# Patient Record
Sex: Male | Born: 1985 | Race: White | Hispanic: Yes | Marital: Married | Smoking: Never smoker
Health system: Southern US, Community
[De-identification: ages and names within clinical notes are randomized; demographics above are authoritative.]

## PROBLEM LIST (undated history)

## (undated) DIAGNOSIS — Z789 Other specified health status: Secondary | ICD-10-CM

## (undated) HISTORY — PX: NO PAST SURGERIES: SHX2092

## (undated) HISTORY — PX: APPENDECTOMY: SHX54

---

## 2008-04-13 ENCOUNTER — Emergency Department (HOSPITAL_COMMUNITY): Admission: EM | Admit: 2008-04-13 | Discharge: 2008-04-13 | Payer: Self-pay | Admitting: Emergency Medicine

## 2011-07-25 ENCOUNTER — Encounter (HOSPITAL_COMMUNITY): Payer: Self-pay | Admitting: *Deleted

## 2011-07-25 DIAGNOSIS — L03019 Cellulitis of unspecified finger: Secondary | ICD-10-CM | POA: Insufficient documentation

## 2011-07-25 DIAGNOSIS — L02519 Cutaneous abscess of unspecified hand: Secondary | ICD-10-CM | POA: Insufficient documentation

## 2011-07-25 NOTE — ED Notes (Signed)
Pt reports he woke up today with a ?bite mark on rt thumb, redness noted

## 2011-07-26 ENCOUNTER — Emergency Department (HOSPITAL_COMMUNITY)
Admission: EM | Admit: 2011-07-26 | Discharge: 2011-07-26 | Disposition: A | Discharge status: Home / Self Care | Payer: Self-pay | Attending: Emergency Medicine | Admitting: Emergency Medicine

## 2011-07-26 DIAGNOSIS — L0390 Cellulitis, unspecified: Secondary | ICD-10-CM

## 2011-07-26 MED ORDER — CLINDAMYCIN HCL 150 MG PO CAPS: 150.0000 mg | ORAL_CAPSULE | Freq: Four times a day (QID) | ORAL | Status: AC

## 2011-07-26 MED ORDER — LIDOCAINE-EPINEPHRINE (PF) 1 %-1:200000 IJ SOLN
INTRAMUSCULAR | Status: AC
  Filled 2011-07-26: qty 10

## 2011-07-26 MED ORDER — IBUPROFEN 800 MG PO TABS: 800.0000 mg | ORAL_TABLET | Freq: Three times a day (TID) | ORAL | Status: AC

## 2011-07-26 MED ORDER — VANCOMYCIN HCL IN DEXTROSE 1-5 GM/200ML-% IV SOLN
1000.0000 mg | Freq: Once | INTRAVENOUS | Status: AC
  Administered 2011-07-26: 1000 mg via INTRAVENOUS
  Filled 2011-07-26: qty 200

## 2011-07-26 MED ORDER — LIDOCAINE HCL (PF) 1 % IJ SOLN
5.0000 mL | Freq: Once | INTRAMUSCULAR | Status: AC
  Administered 2011-07-26: 5 mL

## 2011-07-26 NOTE — ED Notes (Signed)
Per family.  Pt was bitten on Tuesday morning by unknown insect, and area has increased in redness and swelling since that time.  Presently right thumb and hand is swollen and red.  Mild redness into wrist area. No streaking noted.

## 2011-07-26 NOTE — ED Provider Notes (Signed)
History     CSN: [id number]  Arrival date & time 07/25/11  2242   First MD Initiated Contact with Patient 07/26/11 0142      Chief Complaint  Patient presents with  . Insect Bite    (Consider location/radiation/quality/duration/timing/severity/associated sxs/prior treatment) The history is provided by the patient.  Patient woke up today with pain and redness over his right thumb. There is a central area where he believes he may have been bit by something, but he did not visualize any insect or spider. No known trauma otherwise. He has progressive redness and swelling tonight. He denies any weakness or numbness. No history of abscess or similar symptoms in the past. No fevers or chills. No nausea or vomiting. No difficulty using his hand. Pain is sharp in quality and not radiating. Moderate in severity  History reviewed. No pertinent past medical history.  Past Surgical History  Procedure Date  . Appendectomy     No family history on file.  History  Substance Use Topics  . Smoking status: Never Smoker   . Smokeless tobacco: Not on file  . Alcohol Use: Yes      Review of Systems  Constitutional: Negative for fever and chills.  HENT: Negative for neck pain and neck stiffness.   Eyes: Negative for pain.  Respiratory: Negative for shortness of breath.   Cardiovascular: Negative for chest pain.  Gastrointestinal: Negative for abdominal pain.  Genitourinary: Negative for dysuria.  Musculoskeletal: Negative for myalgias.  Skin: Positive for wound.  Neurological: Negative for headaches.  All other systems reviewed and are negative.    Allergies  Review of patient's allergies indicates no known allergies.  Home Medications   Current Outpatient Rx  Name Route Sig Dispense Refill  . ZYRTEC ALLERGY PO Oral Take by mouth.      BP 117/76  Pulse 75  Temp(Src) 98.6 F (37 C) (Oral)  Resp 20  Ht 5\' 9"  (1.753 m)  Wt 166 lb (75.297 kg)  BMI 24.51 kg/m2  SpO2  98%  Physical Exam  Constitutional: He is oriented to person, place, and time. He appears well-developed and well-nourished.  HENT:  Head: Normocephalic and atraumatic.  Eyes: Conjunctivae and EOM are normal. Pupils are equal, round, and reactive to light.  Neck: Trachea normal. Neck supple. No thyromegaly present.  Cardiovascular: Normal rate, regular rhythm, S1 normal, S2 normal and normal pulses.     No systolic murmur is present   No diastolic murmur is present  Pulses:      Radial pulses are 2+ on the right side, and 2+ on the left side.  Pulmonary/Chest: Effort normal and breath sounds normal. He has no wheezes. He has no rhonchi. He has no rales. He exhibits no tenderness.  Abdominal: Soft. Normal appearance and bowel sounds are normal. There is no tenderness. There is no CVA tenderness and negative Murphy's sign.  Musculoskeletal:       Right upper extremity: Area of tenderness and pointing over the PIP joint of right thumb. No active drainage. There is surrounding erythema that extends just to the wrist. No associated lymphadenopathy. Full range of motion throughout with distal neurovascular intact.  Neurological: He is alert and oriented to person, place, and time. He has normal strength. No cranial nerve deficit or sensory deficit. GCS eye subscore is 4. GCS verbal subscore is 5. GCS motor subscore is 6.  Skin: Skin is warm and dry. No rash noted. He is not diaphoretic.  Psychiatric: His speech is normal.  Cooperative and appropriate    ED Course  INCISION AND DRAINAGE Date/Time: 07/26/2011 3:44 AM Performed by: Sunnie Nielsen Authorized by: Sunnie Nielsen Consent: Verbal consent obtained. Risks and benefits: risks, benefits and alternatives were discussed Consent given by: patient Patient understanding: patient states understanding of the procedure being performed Patient consent: the patient's understanding of the procedure matches consent given Procedure consent:  procedure consent matches procedure scheduled Patient identity confirmed: verbally with patient Time out: Immediately prior to procedure a "time out" was called to verify the correct patient, procedure, equipment, support staff and site/side marked as required. Type: abscess Location:  right thumb. Anesthesia: local infiltration Local anesthetic: lidocaine 1% without epinephrine Anesthetic total: 1 ml Risk factor: underlying major vessel, underlying major nerve and coagulopathy Needle gauge: 22 Complexity: simple Drainage: serosanguinous Packing material: Vaseline gauze Patient tolerance: Patient tolerated the procedure well with no immediate complications.   Ink outline of wound performed  IV vancomycin  MDM  Clinical abscess with surrounding cellulitis right thumb.  IV antibiotics. Wound unroofed as above without any significant purulent drainage.  Plan discharge home and return in 48 hours for wound check. Prescription for antibiotics provided. Reliable historian and agrees to return sooner for any worsening condition.          Sunnie Nielsen, MD 07/26/11 [phone]

## 2012-05-26 ENCOUNTER — Emergency Department (HOSPITAL_COMMUNITY)
Admission: EM | Admit: 2012-05-26 | Discharge: 2012-05-26 | Disposition: A | Discharge status: Home / Self Care | Payer: Self-pay | Attending: Emergency Medicine | Admitting: Emergency Medicine

## 2012-05-26 ENCOUNTER — Emergency Department (HOSPITAL_COMMUNITY): Payer: Self-pay

## 2012-05-26 ENCOUNTER — Other Ambulatory Visit: Payer: Self-pay

## 2012-05-26 ENCOUNTER — Encounter (HOSPITAL_COMMUNITY): Payer: Self-pay | Admitting: Emergency Medicine

## 2012-05-26 DIAGNOSIS — R071 Chest pain on breathing: Secondary | ICD-10-CM | POA: Insufficient documentation

## 2012-05-26 DIAGNOSIS — R0789 Other chest pain: Secondary | ICD-10-CM

## 2012-05-26 DIAGNOSIS — R209 Unspecified disturbances of skin sensation: Secondary | ICD-10-CM | POA: Insufficient documentation

## 2012-05-26 MED ORDER — HYDROCODONE-ACETAMINOPHEN 5-325 MG PO TABS: 1.0000 | ORAL_TABLET | Freq: Four times a day (QID) | ORAL | Status: DC | PRN

## 2012-05-26 MED ORDER — NAPROXEN SODIUM 220 MG PO TABS
ORAL_TABLET | ORAL | Status: DC
Start: 2012-05-26 — End: 2013-10-15

## 2012-05-26 MED ORDER — IBUPROFEN 800 MG PO TABS
800.0000 mg | ORAL_TABLET | Freq: Once | ORAL | Status: AC
  Administered 2012-05-26: 800 mg via ORAL
  Filled 2012-05-26: qty 1

## 2012-05-26 MED ORDER — HYDROCODONE-ACETAMINOPHEN 5-325 MG PO TABS
1.0000 | ORAL_TABLET | Freq: Once | ORAL | Status: AC
  Administered 2012-05-26: 1 via ORAL
  Filled 2012-05-26: qty 1

## 2012-05-26 NOTE — ED Provider Notes (Signed)
History     CSN: [id number]  Arrival date & time 05/26/12  0202   First MD Initiated Contact with Patient 05/26/12 0327      Chief Complaint  Patient presents with  . Chest Pain    (Consider location/radiation/quality/duration/timing/severity/associated sxs/prior treatment) HPI This is a 26 year old Hispanic male; his friend serves as Equities trader. He is here with left-sided chest pain that began about 1:30 this morning. He describes it as throbbing and is associated with some paresthesias of the left arm. It has been waxing and waning. It is present on the left side of his chest as well as his left scapula. The left chest pain extends down to his upper abdomen. The pain is worse with movement of the neck, of the left shoulder, of the chest or with deep breathing. It is also worse with palpation. The pain is moderate to severe. The pain makes it difficult to take a deep breath. He is not short of breath. He is not nauseated. He denies chest injury but does work with [employer] for living.  History reviewed. No pertinent past medical history.  Past Surgical History  Procedure Date  . Appendectomy     Family History  Problem Relation Age of Onset  . CAD Father   . Hypertension Other     History  Substance Use Topics  . Smoking status: Never Smoker   . Smokeless tobacco: Not on file  . Alcohol Use: Yes     Comment: occ      Review of Systems  All other systems reviewed and are negative.    Allergies  Review of patient's allergies indicates no known allergies.  Home Medications   Current Outpatient Rx  Name  Route  Sig  Dispense  Refill  . PSEUDOEPHEDRINE HCL 60 MG PO TABS   Oral   Take 60 mg by mouth every 4 (four) hours as needed. For cold symptoms           BP 121/83  Pulse 88  Temp 97.9 F (36.6 C) (Oral)  Resp 12  SpO2 100%  Physical Exam General: Well-developed, well-nourished male in no acute distress; appearance consistent with age of record;  appears uncomfortable HENT: normocephalic, atraumatic Eyes: pupils equal round and reactive to light; extraocular muscles intact Neck: supple; pain is somewhat exacerbated with movement of neck Heart: regular rate and rhythm; no murmurs, rubs or gallops Lungs: clear to auscultation bilaterally Chest: Tender to palpation left side of chest without deformity or crepitus Abdomen: soft; nondistended; left upper quadrant tenderness; no masses or hepatosplenomegaly; bowel sounds present Extremities: No deformity; full range of motion but with pain on range of motion of left shoulder Neurologic: Awake, alert; motor function intact in all extremities and symmetric; no facial droop Skin: Warm and dry Psychiatric: Mildly anxious    ED Course  Procedures (including critical care time)     MDM  Nursing notes and vitals signs, including pulse oximetry, reviewed.  Summary of this visit's results, reviewed by myself:  Imaging Studies: Dg Chest 2 View  05/26/2012  *RADIOLOGY REPORT*  Clinical Data: Chest pain  CHEST - 2 VIEW  Comparison: None.  Findings: Shallow inspiration. The heart size and pulmonary vascularity are normal. The lungs appear clear and expanded without focal air space disease or consolidation. No blunting of the costophrenic angles.  No pneumothorax.  Mediastinal contours appear intact.  IMPRESSION: No evidence of active pulmonary disease.   Original Report Authenticated By: Burman Nieves, M.D.  Date: 05/26/2012 2:10 AM  Rate: 85  Rhythm: normal sinus rhythm  QRS Axis: normal  Intervals: normal  ST/T Wave abnormalities: normal  Conduction Disutrbances: none  Narrative Interpretation: unremarkable  Comparison with previous EKG: None available  3:40 AM Pain appears to be chest wall pain although cervical radiculopathy cannot be excluded especially in light of change with movement of the neck and paresthesias of the left arm. We will treat with analgesics. He was  advised that most such pain improves and several days. He was advised to minimize lifting with his left side.            Hanley Seamen, MD 05/26/12 [phone]

## 2012-05-26 NOTE — ED Notes (Signed)
Pt presents with c/o pain to his left side points from his lower abd all the way up his left side to his neck  Pt states pain started about 0130 and it comes and goes but when it comes it makes it difficult for him to breathe  Pt denies any other sxs  Pt states he was laying down when it started

## 2012-05-26 NOTE — ED Notes (Signed)
Pt admits to having 3 beers tonight

## 2013-09-07 IMAGING — CR DG CHEST 2V
2 series · 2 of 2 positions shown · non-contrast
Comparison: None.

CLINICAL DATA: Chest pain

CHEST - 2 VIEW

[w chest pa]
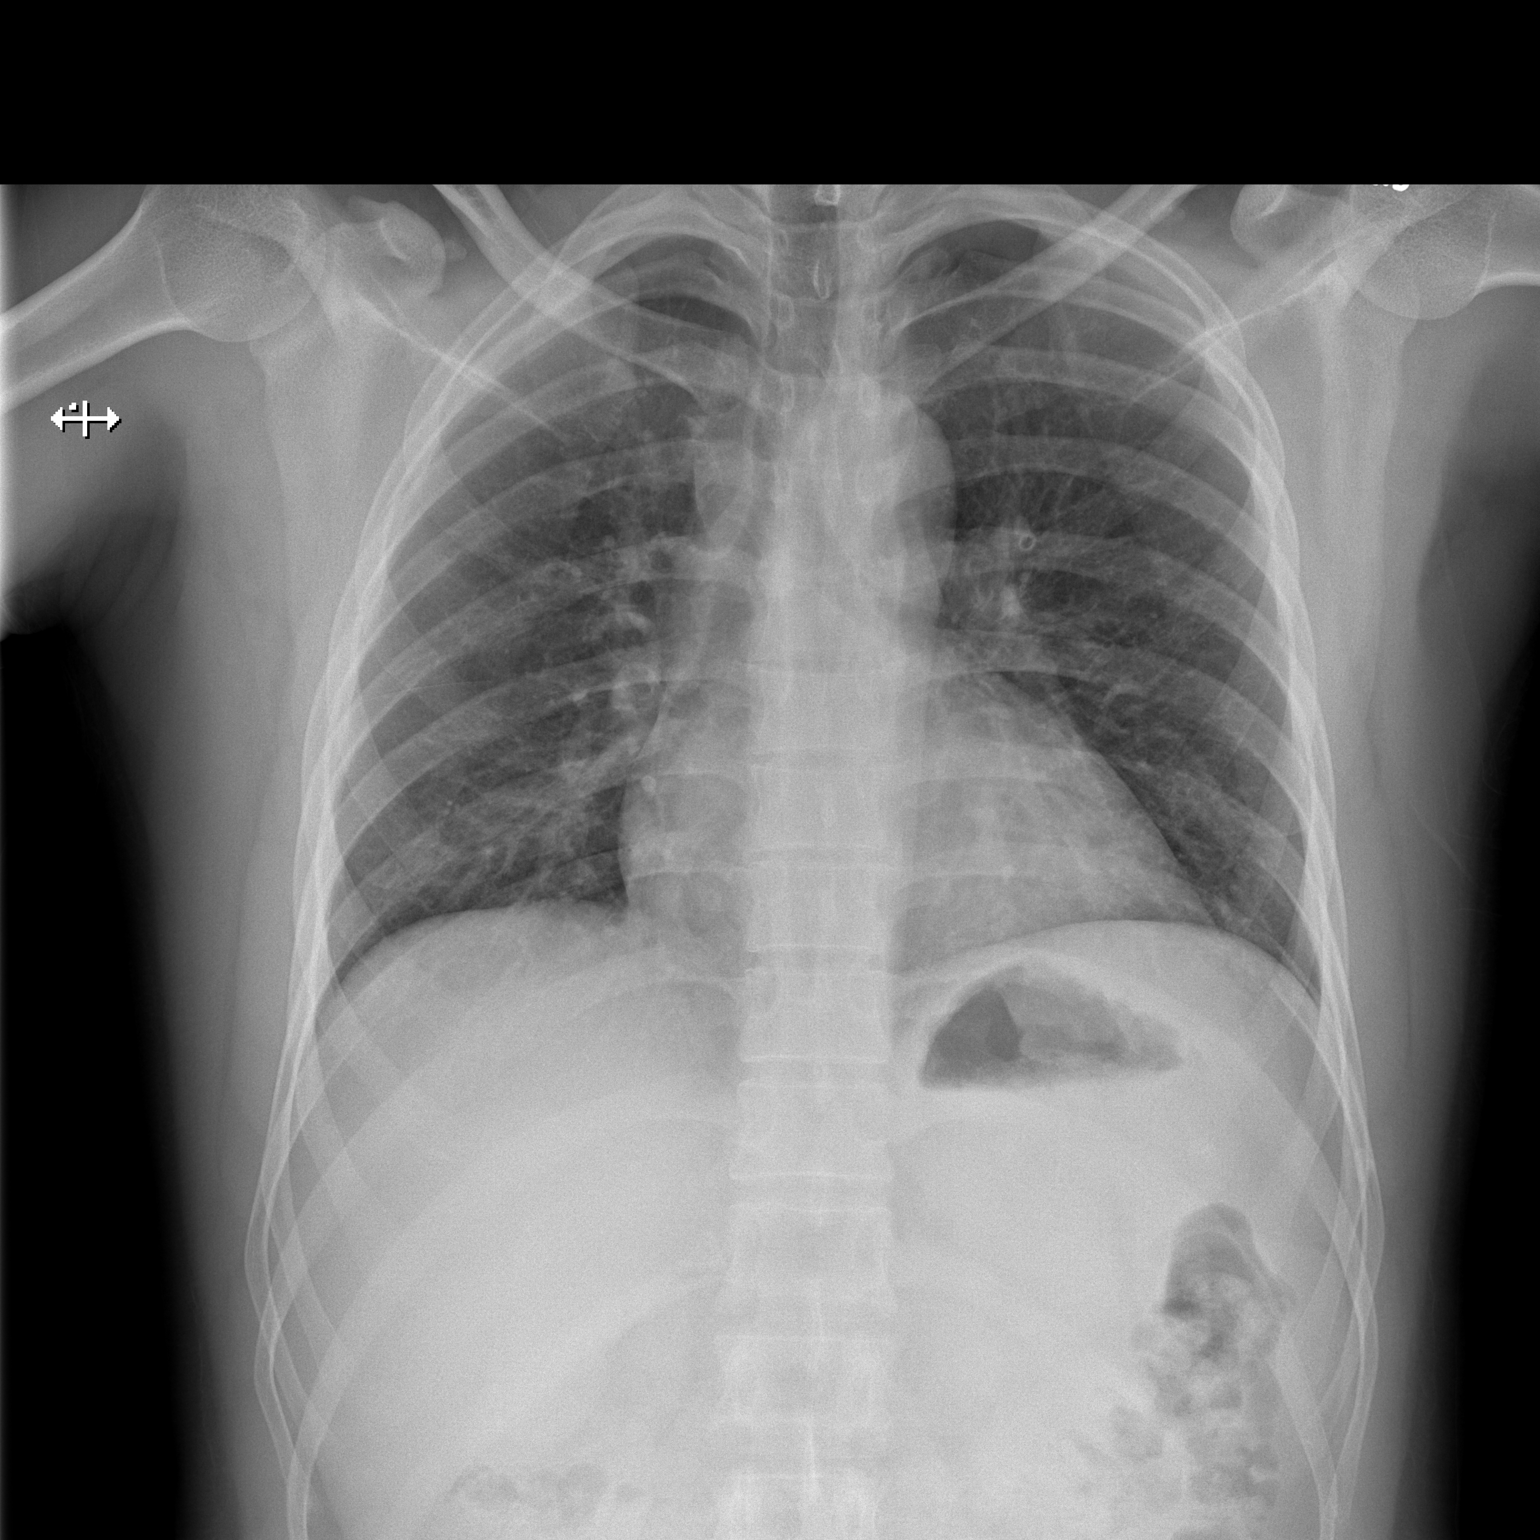

[w chest lat]
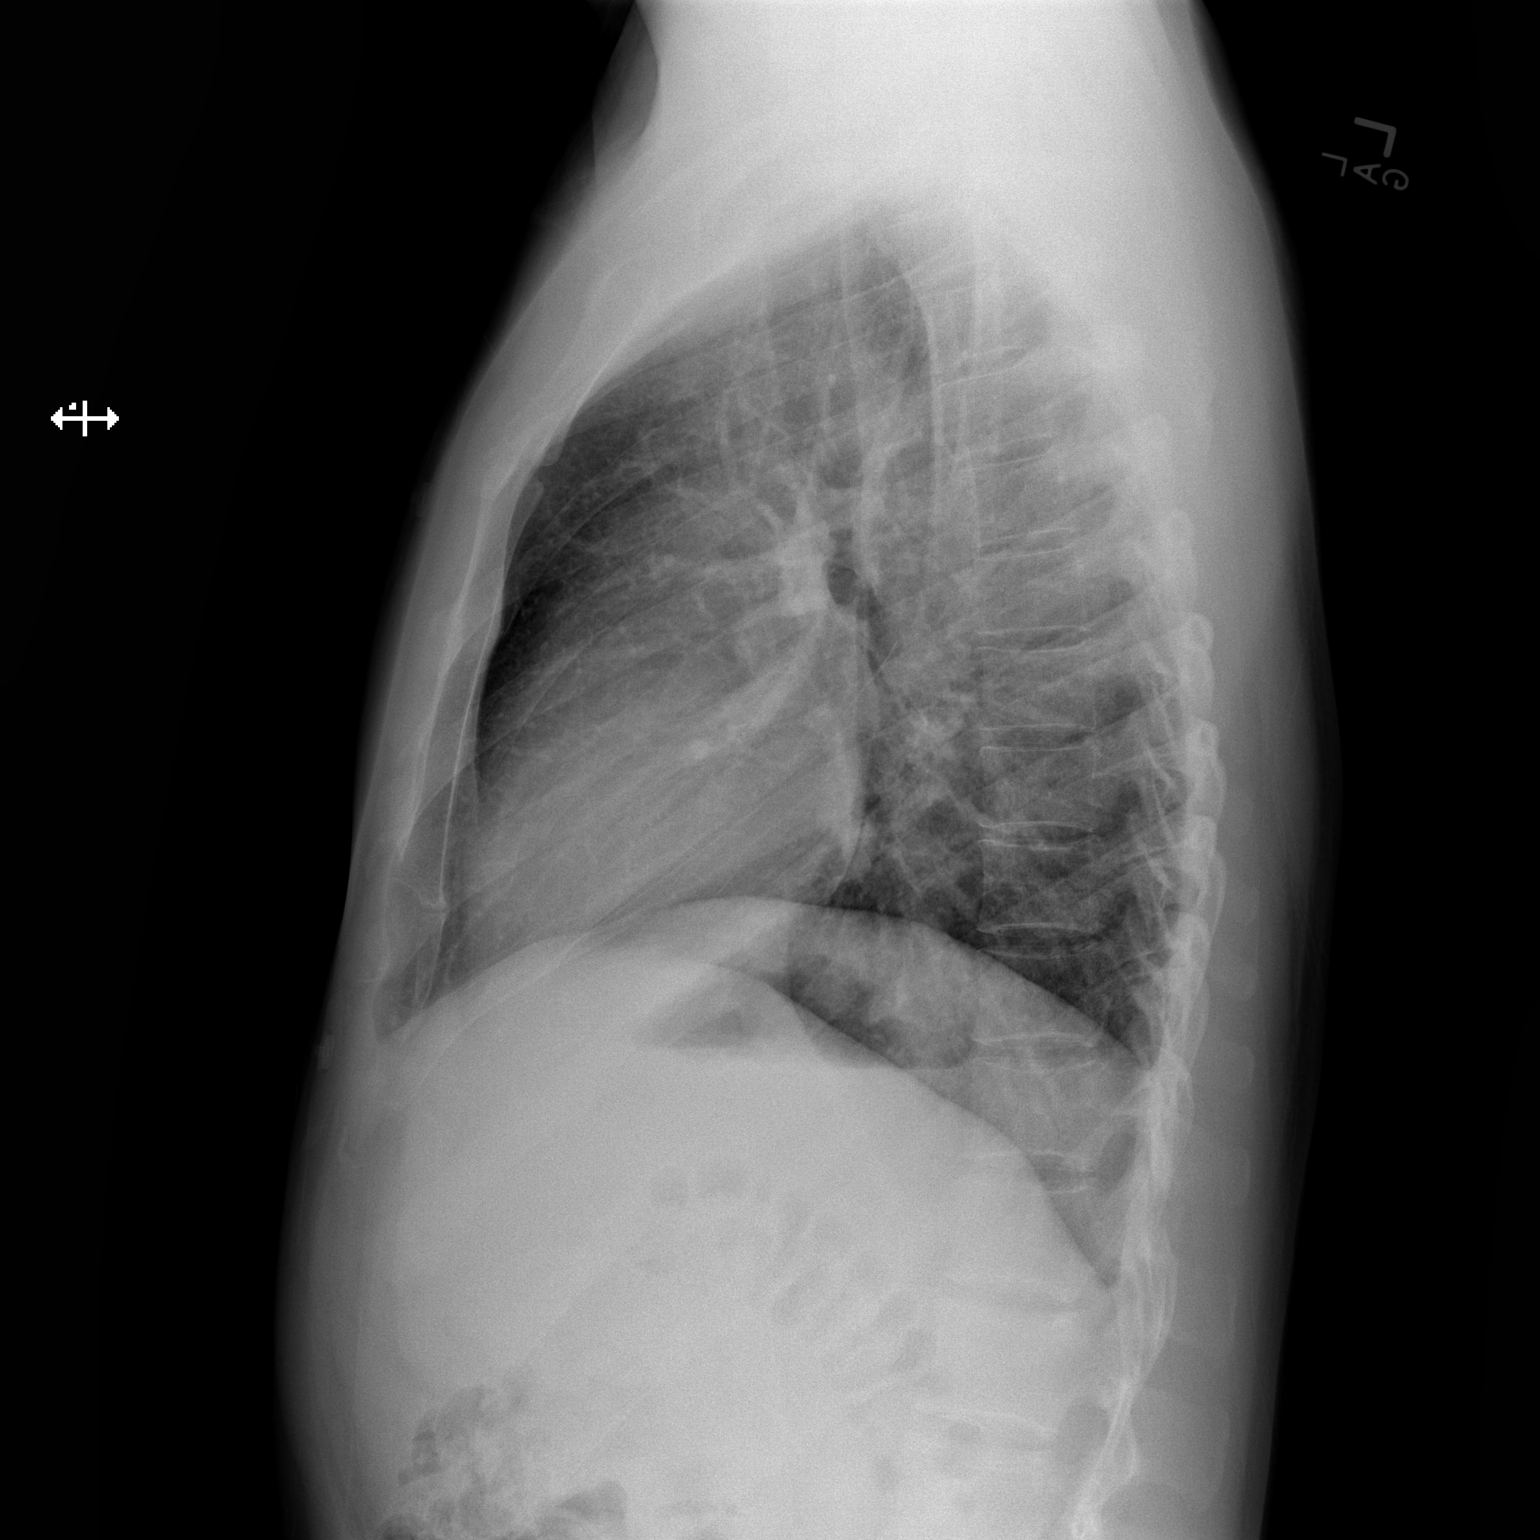

[2 of 2 positions shown; findings below may reference images not displayed]

FINDINGS: Shallow inspiration. The heart size and pulmonary
vascularity are normal. The lungs appear clear and expanded without
focal air space disease or consolidation. No blunting of the
costophrenic angles.  No pneumothorax.  Mediastinal contours appear
intact.
IMPRESSION: No evidence of active pulmonary disease.

## 2013-10-15 ENCOUNTER — Encounter (HOSPITAL_COMMUNITY): Payer: Self-pay | Admitting: Emergency Medicine

## 2013-10-15 ENCOUNTER — Emergency Department (HOSPITAL_COMMUNITY)
Admission: EM | Admit: 2013-10-15 | Discharge: 2013-10-16 | Disposition: A | Discharge status: Home / Self Care | Payer: Self-pay | Attending: Emergency Medicine | Admitting: Emergency Medicine

## 2013-10-15 ENCOUNTER — Emergency Department (HOSPITAL_COMMUNITY): Payer: Self-pay

## 2013-10-15 DIAGNOSIS — Z79899 Other long term (current) drug therapy: Secondary | ICD-10-CM | POA: Insufficient documentation

## 2013-10-15 DIAGNOSIS — Z792 Long term (current) use of antibiotics: Secondary | ICD-10-CM | POA: Insufficient documentation

## 2013-10-15 DIAGNOSIS — J40 Bronchitis, not specified as acute or chronic: Secondary | ICD-10-CM | POA: Insufficient documentation

## 2013-10-15 DIAGNOSIS — R062 Wheezing: Secondary | ICD-10-CM

## 2013-10-15 MED ORDER — ALBUTEROL SULFATE (2.5 MG/3ML) 0.083% IN NEBU
5.0000 mg | INHALATION_SOLUTION | Freq: Once | RESPIRATORY_TRACT | Status: AC
  Administered 2013-10-15: 5 mg via RESPIRATORY_TRACT
  Filled 2013-10-15: qty 6

## 2013-10-15 MED ORDER — IPRATROPIUM BROMIDE 0.02 % IN SOLN
0.5000 mg | Freq: Once | RESPIRATORY_TRACT | Status: AC
  Administered 2013-10-15: 0.5 mg via RESPIRATORY_TRACT
  Filled 2013-10-15: qty 2.5

## 2013-10-15 MED ORDER — PREDNISONE 20 MG PO TABS
60.0000 mg | ORAL_TABLET | Freq: Once | ORAL | Status: AC
  Administered 2013-10-15: 60 mg via ORAL
  Filled 2013-10-15: qty 3

## 2013-10-15 NOTE — ED Notes (Signed)
Pt was seen at urgent care a couple of days ago and was diagnosed with bronchitis and was started on antibiotics but states he has gotten worse since then

## 2013-10-15 NOTE — ED Provider Notes (Signed)
CSN: [id number]     Arrival date & time 10/15/13  2218 History   First MD Initiated Contact with Patient 10/15/13 2255     Chief Complaint  Patient presents with  . Wheezing  . Cough     (Consider location/radiation/quality/duration/timing/severity/associated sxs/prior Treatment) HPI Thomas Fields is a 28 y.o. male who presents to emergency department complaining of shortness of breath, cough, wheezing. Patient states his symptoms began a week and a half ago. He states it began cough. He was in urgent care a few days ago was started on antibiotics. Patient states that it's been getting worse since then. Patient does not recall which antibiotics he was prescribed. States currently he's having shortness of breath, chest tightness, wheezing, severe cough. Patient is currently not taking any medications for his symptoms. He denies smoking. He denies history of asthma. He has used inhalers in the past. He denies any fever, chills. No other complaints.  History reviewed. No pertinent past medical history. Past Surgical History  Procedure Laterality Date  . Appendectomy     Family History  Problem Relation Age of Onset  . CAD Father   . Hypertension Other    History  Substance Use Topics  . Smoking status: Never Smoker   . Smokeless tobacco: Not on file  . Alcohol Use: Yes     Comment: occ    Review of Systems  Constitutional: Negative for fever and chills.  Respiratory: Positive for cough, chest tightness, shortness of breath and wheezing.   Cardiovascular: Negative for palpitations and leg swelling.  Gastrointestinal: Negative for nausea, vomiting, abdominal pain, diarrhea and abdominal distention.  Musculoskeletal: Negative for arthralgias, myalgias, neck pain and neck stiffness.  Skin: Negative for rash.  Allergic/Immunologic: Negative for immunocompromised state.  Neurological: Negative for dizziness, weakness, light-headedness, numbness and headaches.       Allergies  Review of patient's allergies indicates no known allergies.  Home Medications   Prior to Admission medications   Medication Sig Start Date End Date Taking? Authorizing Provider  cetirizine (ZYRTEC) 10 MG tablet Take 10 mg by mouth daily.   Yes Historical Provider, MD  dextromethorphan-guaiFENesin (MUCINEX DM) 30-600 MG per 12 hr tablet Take 1 tablet by mouth 2 (two) times daily.   Yes Historical Provider, MD  Ephedrine-Guaifenesin (BRONKAID) 25-400 MG TABS Take 1 tablet by mouth daily.   Yes Historical Provider, MD  sulfamethoxazole-trimethoprim (BACTRIM DS) 800-160 MG per tablet Take 1 tablet by mouth 2 (two) times daily.   Yes Historical Provider, MD   BP 146/81  Pulse 108  Temp(Src) 98.1 F (36.7 C) (Oral)  Resp 16  SpO2 100% Physical Exam  Nursing note and vitals reviewed. Constitutional: He appears well-developed and well-nourished. No distress.  HENT:  Head: Normocephalic and atraumatic.  Eyes: Conjunctivae are normal.  Neck: Neck supple.  Cardiovascular: Normal rate, regular rhythm and normal heart sounds.   Pulmonary/Chest: Effort normal. No respiratory distress. He has wheezes. He has no rales.  Inspiratory and expiratory wheezes bilaterally, decreased air movement.  Abdominal: Soft. Bowel sounds are normal. He exhibits no distension. There is no tenderness. There is no rebound.  Musculoskeletal: He exhibits no edema.  Neurological: He is alert.  Skin: Skin is warm and dry.    ED Course  Procedures (including critical care time) Labs Review Labs Reviewed - No data to display  Imaging Review Dg Chest 2 View  10/16/2013   CLINICAL DATA:  WHEEZING COUGH  EXAM: CHEST  2 VIEW  COMPARISON:  DG CHEST  2 VIEW dated 05/26/2012  FINDINGS: The lungs are hyperinflated. The interstitial markings are mildly increased though not greatly changed from the previous study. The cardiopericardial silhouette is normal in size. The pulmonary vascularity is not engorged.  There is no pleural effusion or pneumothorax. The mediastinum is normal in width. The observed portions of the bony thorax appear normal.  IMPRESSION: There is hyperinflation consistent with COPD. Coarse interstitial lung markings bilaterally may reflect subsegmental atelectasis. There is no alveolar pneumonia.   Electronically Signed   By: David  [place]   On: 10/16/2013 00:54     EKG Interpretation None      MDM   Final diagnoses:  Bronchitis  Wheezing   Patient dry cough, wheezing, shortness of breath, chest tightness. Wheezing noted on the exam. Will get chest x-ray given cough for a week and a half at this time. We'll try some nebulized treatments, prednisone 60 mg ordered by mouth. Will monitor.  12:14 AM Patient is feeling much better after breathing treatment and prednisone. His chest x-ray still pending. We'll do another neb.  12:58 AM Chest x-ray showing hyperinflation, consistent with COPD. Patient is feeling much better. Home with prednisone and inhaler. Follow with primary care Dr.  Ceasar MonsFiled Vitals:   10/15/13 2243  BP: 146/81  Pulse: 108  Temp: 98.1 F (36.7 C)  TempSrc: Oral  Resp: 16  SpO2: 100%     Lottie Musselatyana A Carisa Backhaus, PA-C 10/16/13 [id number]

## 2013-10-15 NOTE — ED Notes (Signed)
Family states pt has had cough and cold sxs for the past week and a half  Yesterday pt stated having difficulty breathing   Pt has audible wheezing in triage with nonproductive cough  Family states pt cannot lay flat and is c/o chest tightness

## 2013-10-16 MED ORDER — AEROCHAMBER Z-STAT PLUS/MEDIUM MISC
1.0000 | Freq: Once | Status: AC
  Administered 2013-10-16: 1
  Filled 2013-10-16: qty 1

## 2013-10-16 MED ORDER — ALBUTEROL SULFATE HFA 108 (90 BASE) MCG/ACT IN AERS
2.0000 | INHALATION_SPRAY | RESPIRATORY_TRACT | Status: DC
  Administered 2013-10-16: 2 via RESPIRATORY_TRACT
  Filled 2013-10-16: qty 6.7

## 2013-10-16 MED ORDER — ALBUTEROL SULFATE (2.5 MG/3ML) 0.083% IN NEBU
5.0000 mg | INHALATION_SOLUTION | Freq: Once | RESPIRATORY_TRACT | Status: AC
  Administered 2013-10-16: 5 mg via RESPIRATORY_TRACT
  Filled 2013-10-16: qty 6

## 2013-10-16 MED ORDER — PREDNISONE 20 MG PO TABS: 40.0000 mg | ORAL_TABLET | Freq: Every day | ORAL | Status: AC

## 2013-10-16 NOTE — Discharge Instructions (Signed)
Use an inhaler 2 puffs every 4 hrs. Prednisone as prescribed for 5 days. Follow up with primary care doctor.     Bronchitis Bronchitis is inflammation of the airways that extend from the windpipe into the lungs (bronchi). The inflammation often causes mucus to develop, which leads to a cough. If the inflammation becomes severe, it may cause shortness of breath. CAUSES  Bronchitis may be caused by:   Viral infections.   Bacteria.   Cigarette smoke.   Allergens, pollutants, and other irritants.  SIGNS AND SYMPTOMS  The most common symptom of bronchitis is a frequent cough that produces mucus. Other symptoms include:  Fever.   Body aches.   Chest congestion.   Chills.   Shortness of breath.   Sore throat.  DIAGNOSIS  Bronchitis is usually diagnosed through a medical history and physical exam. Tests, such as chest X-rays, are sometimes done to rule out other conditions.  TREATMENT  You may need to avoid contact with whatever caused the problem (smoking, for example). Medicines are sometimes needed. These may include:  Antibiotics. These may be prescribed if the condition is caused by bacteria.  Cough suppressants. These may be prescribed for relief of cough symptoms.   Inhaled medicines. These may be prescribed to help open your airways and make it easier for you to breathe.   Steroid medicines. These may be prescribed for those with recurrent (chronic) bronchitis. HOME CARE INSTRUCTIONS  Get plenty of rest.   Drink enough fluids to keep your urine clear or pale yellow (unless you have a medical condition that requires fluid restriction). Increasing fluids may help thin your secretions and will prevent dehydration.   Only take over-the-counter or prescription medicines as directed by your health care provider.  Only take antibiotics as directed. Make sure you finish them even if you start to feel better.  Avoid secondhand smoke, irritating chemicals, and  strong fumes. These will make bronchitis worse. If you are a smoker, quit smoking. Consider using nicotine gum or skin patches to help control withdrawal symptoms. Quitting smoking will help your lungs heal faster.   Put a cool-mist humidifier in your bedroom at night to moisten the air. This may help loosen mucus. Change the water in the humidifier daily. You can also run the hot water in your shower and sit in the bathroom with the door closed for 5 10 minutes.   Follow up with your health care provider as directed.   Wash your hands frequently to avoid catching bronchitis again or spreading an infection to others.  SEEK MEDICAL CARE IF: Your symptoms do not improve after 1 week of treatment.  SEEK IMMEDIATE MEDICAL CARE IF:  Your fever increases.  You have chills.   You have chest pain.   You have worsening shortness of breath.   You have bloody sputum.  You faint.  You have lightheadedness.  You have a severe headache.   You vomit repeatedly. MAKE SURE YOU:   Understand these instructions.  Will watch your condition.  Will get help right away if you are not doing well or get worse. Document Released: 06/04/2005 Document Revised: 03/25/2013 Document Reviewed: 01/27/2013 [facility] Patient Information 2014 [employer], [place].   Emergency Department Resource Guide 1) Find a Doctor and Pay Out of Pocket Although you won't have to find out who is covered by your insurance plan, it is a good idea to ask around and get recommendations. You will then need to call the office and see if the doctor  you have chosen will accept you as a new patient and what types of options they offer for patients who are self-pay. Some doctors offer discounts or will set up payment plans for their patients who do not have insurance, but you will need to ask so you aren't surprised when you get to your appointment.  2) Contact Your Local Health Department Not all health departments have  doctors that can see patients for sick visits, but many do, so it is worth a call to see if yours does. If you don't know where your local health department is, you can check in your phone book. The CDC also has a tool to help you locate your state's health department, and many state websites also have listings of all of their local health departments.  3) Find a Walk-in Clinic If your illness is not likely to be very severe or complicated, you may want to try a walk in clinic. These are popping up all over the country in pharmacies, drugstores, and shopping centers. They're usually staffed by nurse practitioners or physician assistants that have been trained to treat common illnesses and complaints. They're usually fairly quick and inexpensive. However, if you have serious medical issues or chronic medical problems, these are probably not your best option.  No Primary Care Doctor: - Call Health Connect at  [phone] - they can help you locate a primary care doctor that  accepts your insurance, provides certain services, etc. - Physician Referral Service- [phone]  Chronic Pain Problems: Organization         Address  Phone   Notes  [facility]  [phone] Patients need to be referred by their primary care doctor.   Medication Assistance: Organization         Address  Phone   Notes  [facility] Medication [facility] [address] [place], [place] [address] [phone] --Must be a resident of [facility] -- Must have NO insurance coverage whatsoever (no Medicaid/ Medicare, etc.) -- The pt. MUST have a primary care doctor that directs their care regularly and follows them in the community   MedAssist  [phone]   [employer]  [phone]    Agencies that provide inexpensive medical care: Organization         Address  Phone   Notes  [facility]  [phone]   [facility]    [phone]    [facility] [address] [place], [place] [address] [phone]   [facility] [address], [place] [phone]   Planned Parenthood    [phone]   [facility]    [phone]   [facility] and [facility]  [address], [place] Phone:  [phone], Fax:  [phone] Hours of Operation:  9 am - 6 pm, M-F.  Also accepts Medicaid/Medicare and self-pay.  [facility]  [address], [place] Phone: [phone], Fax: [phone]. Hours of Operation:  8:30 am - 5:30 pm, M-F.  Also accepts Medicaid and self-pay.  [facility] [place] [address], [place] Phone: [phone]   [facility] [address], [place], [place] [phone], Ext. [phone] Mondays & Thursdays: 7-9 AM.  First 15 patients are seen on a first come, first serve basis.    Medicaid-accepting [facility] Providers:  Organization  Address  Phone   Notes  [facility] [address], [place] [phone] Also accepts self-pay patients.  [facility] [address], [place]  [phone]   [place], [address], [place] [phone]   [facility] [address] [place] [phone]   Lucianne Lei [address], [place]   [phone] Only accepts [place] Access [place] patients after they have their name applied to their card.   Self-Pay (no insurance) in [facility]:  Organization         Address  Phone   Notes  Sickle Cell Patients, [facility] [place] [phone]   [facility] [place] [phone]   [facility] [place]  [place], [place],  [phone]   [facility]/Dr. Osei-Bonsu  [address] [place] or [address], [employer] [phone] Phone number for both [place] and [place] locations is the same.  [facility]gent [facility] and [facility] [address] [place] [phone]   [facility] [address], [place] or [address] [phone]   [facility] [address], [place] [phone], phone; [phone], fax Sees patients 1st and 3rd Saturday of every month.  Must not qualify for public or private insurance (i.e. Medicaid, Medicare, [place] Health Choice, Veterans' Benefits)  Household income should be no more than 200% of the poverty level The clinic cannot treat you if you are pregnant or think you are pregnant  Sexually transmitted diseases are not treated at the clinic.    Dental Care: Organization         Address  Phone  Notes  [facility] Department of [facility] [place] [phone] Accepts children up to age 23 who are enrolled in [place] or [place]; pregnant women with a Medicaid card; and children who have applied for Medicaid or [place] Health Choice, but were declined, whose parents can pay a reduced fee at time of service.  [facility] Department of [facility]  [address], [place] [phone] Accepts children up to age 56 who are enrolled in [place] or [place]; pregnant women with a Medicaid card; and children who have applied for Medicaid or [place] Health Choice, but were declined, whose parents can pay a reduced fee at time of service.  [place] [facility] Access PRO[facility]  [place] [phone] Patients are seen by appointment only. Walk-ins are not accepted. [place] will see patients 84 years of age and older. Monday - Tuesday (8am-5pm) Most Wednesdays (8:30-5pm) $30 per visit, cash only  [facility]  Adult Dental Access PROGRAM  [address], [facility] [phone] Patients are seen by appointment only. Walk-ins are not accepted. [place] will see patients 21 years of age and older. One Wednesday Evening (Monthly: Volunteer Based).  $30 per visit, cash only  [employer]  [phone] for adults; Children under age 81, call Graduate Pediatric Dentistry at [phone]. Children aged 71-14, please call [phone] to request a pediatric application.  Dental services are provided in all areas of dental care including  fillings, crowns and bridges, complete and partial dentures, implants, gum treatment, root canals, and extractions. Preventive care is also provided. Treatment is provided to both adults and children. Patients are selected via a lottery and there is often a waiting list.   [facility] [address] [place]  [phone] [email or web address]   [facility] [address] [place], [place] [phone], Ext. [phone] Second and Fourth Thursday of each month, opens at 6:30 AM; Clinic ends at 9 AM.  Patients are seen on a first-come first-served basis, and a limited number are seen during each clinic.   [facility]  [address] [place], [place] [phone]   Eligibility Requirements You must have lived in [place], [place], or [place] counties for at least the last three months.   You cannot be eligible for state or federal sponsored [employer], including [employer], [place], or [employer].   You generally cannot be eligible for healthcare insurance through your employer.    How to apply: Eligibility screenings are held every Tuesday and Wednesday afternoon from 1:00 pm until 4:00 pm. You do not need an appointment for the interview!  [facility] [address] [place], [place]   [place]  [place] Department  [place]  [phone]    Behavioral Health Resources in the Community: Intensive Outpatient Programs Organization         Address  Phone  Notes  [place]. [address] [place], [place] [phone]   [facility] Outpatient [address], [place], [place]   [facility]: [facility] [address] [place], [place]   [place] [address]  [place], [place] or [phone]   Substance Abuse Resources Organization         Address  Phone  Notes  [facility] Drug Services  [phone]   [place]  [phone]   [facility] [place]   Chinita Pester  [phone]   Residential & Outpatient Substance Abuse Program  [phone]   Psychological Services Organization         Address  Phone  Notes  [facility] [place]  [place]  [phone]   [place] [address] [place] or [phone]    Mobile Crisis Teams Organization         Address  Phone  Notes  [facility], Mobile Crisis Care Unit  [phone]   [facility]  [address] [place], [place]   [place] [address] [employer] [phone]    Self-Help/Support Groups Organization         Address  Phone             Notes  [place] [place] - variety of support groups  [place] Call for more information  Narcotics Anonymous (NA), [facility] [address], [employer] [place]  2 meetings at this location   Special educational needs teacher         Address  Phone  Notes  [facility] [place],    [place]  [place]  [address], [place] [address]17, [place], [place]   [place],  [place] [phone] Admissions: 8am-3pm M-F  °[facility] [address]    °[place], [place] [phone]   °[facility] [address], [place], [place] [phone]   °[facility] [address]  °[place], [place] [phone]   °[facility] - Intensive Outpatient [address], [place], [place] [phone]   °[facility] [address]  °[place], [place] 1-877-615-2722 or [phone]   °[facility] ([facility]) [address] [place], [place] [phone] Accepts Medicaid  °[facility] [address]  °[place] 1-800-659-3381 Substance Abuse/Addiction Treatment  ° °[place] Behavioral Health Resources °Organization         Address  Phone  Notes  °[facility]  ([phone]) [phone]   °Julie Brannon, PhD [address] [place], [place]   ([phone]) [phone] or (336) [phone]   °[facility]   [address] °[place], [place] ([phone]) [phone]   °[facility] [address], [place], [place] ([phone]) [phone] Insurance/Medicaid/sponsorship through [facility]  °[facility] [address]                                    [place], [place] [phone]) [phone] Therapy/tele-psych/case  °[facility] [address]  ° [place], [place] ([phone]) [phone]    °Dr. Arfeen  ([phone]) [phone]   °[facility] [place]  [employer] [place] [facility] 1) [address], [place] °2) [address], [place] °3)  [address], [place] (336) [phone] °(336) [phone] ° °(336) [phone]   °[place] Child Abuse Hotline (336) 342-1394 or (336) [phone] (After Hours)    ° ° ° °

## 2013-10-16 NOTE — ED Provider Notes (Signed)
Medical screening examination/treatment/procedure(s) were performed by non-physician practitioner and as supervising physician I was immediately available for consultation/collaboration.   EKG Interpretation None        Lyanne CoKevin M Andrw Mcguirt, MD 10/16/13 0130

## 2014-10-05 ENCOUNTER — Emergency Department (HOSPITAL_COMMUNITY)
Admission: EM | Admit: 2014-10-05 | Discharge: 2014-10-06 | Disposition: A | Discharge status: Home / Self Care | Payer: Self-pay | Attending: Emergency Medicine | Admitting: Emergency Medicine

## 2014-10-05 ENCOUNTER — Encounter (HOSPITAL_COMMUNITY): Payer: Self-pay

## 2014-10-05 DIAGNOSIS — J029 Acute pharyngitis, unspecified: Secondary | ICD-10-CM | POA: Insufficient documentation

## 2014-10-05 DIAGNOSIS — B09 Unspecified viral infection characterized by skin and mucous membrane lesions: Secondary | ICD-10-CM

## 2014-10-05 DIAGNOSIS — Z79899 Other long term (current) drug therapy: Secondary | ICD-10-CM | POA: Insufficient documentation

## 2014-10-05 DIAGNOSIS — Z7952 Long term (current) use of systemic steroids: Secondary | ICD-10-CM | POA: Insufficient documentation

## 2014-10-05 LAB — RAPID STREP SCREEN (MED CTR MEBANE ONLY): STREPTOCOCCUS, GROUP A SCREEN (DIRECT): NEGATIVE

## 2014-10-05 NOTE — ED Provider Notes (Signed)
CSN: [id number]     Arrival date & time 10/05/14  2256 History  This chart was scribed for non-physician practitioner, Earley FavorGail Arvis Miguez, NP working with Layla MawKristen N Ward, DO by Freida Busmaniana Omoyeni, ED Scribe. This patient was seen in room WTR9/WTR9 and the patient's care was started at 11:13 PM.    Chief Complaint  Patient presents with  . Rash  . Medication Reaction   The history is provided by the patient and the spouse. No language interpreter was used.     HPI Comments:  Thomas Fields is a 29 y.o. male who presents to the Emergency Department complaining of an itchy rash to his chest that worsened a few hours PTA.  He noticed a few spots during the week and applied aloe with relief. He reports associated  fever yesterday with max temp of 102, which has resolved, and sore throat for 3 days. Pt denies neck stiffness/pain.   History reviewed. No pertinent past medical history. Past Surgical History  Procedure Laterality Date  . Appendectomy     Family History  Problem Relation Age of Onset  . CAD Father   . Hypertension Other    History  Substance Use Topics  . Smoking status: Never Smoker   . Smokeless tobacco: Not on file  . Alcohol Use: Yes     Comment: occ    Review of Systems  Constitutional: Positive for fever. Negative for chills.  HENT: Positive for sore throat.   Respiratory: Negative for cough, shortness of breath and wheezing.   Musculoskeletal: Negative for myalgias, neck pain and neck stiffness.  Skin: Positive for rash.  Neurological: Negative for dizziness, weakness and headaches.      Allergies  Review of patient's allergies indicates no known allergies.  Home Medications   Prior to Admission medications   Medication Sig Start Date End Date Taking? Authorizing Provider  acetaminophen (TYLENOL) 500 MG tablet Take 500 mg by mouth every 6 (six) hours as needed for moderate pain.   Yes Historical Provider, MD  albuterol (PROVENTIL HFA;VENTOLIN HFA) 108  (90 BASE) MCG/ACT inhaler Inhale 2 puffs into the lungs every 6 (six) hours as needed for wheezing or shortness of breath.   Yes Historical Provider, MD  cetirizine (ZYRTEC) 10 MG tablet Take 10 mg by mouth daily.   Yes Historical Provider, MD  Diphenhydramine-PE-APAP [facility](MUCINEX SINUS-MAX NIGHT TIME PO) Take 2 tablets by mouth at bedtime as needed (cough).   Yes Historical Provider, MD  guaiFENesin (MUCINEX) 600 MG 12 hr tablet Take 1,200 mg by mouth 2 (two) times daily as needed for cough.   Yes Historical Provider, MD  guaiFENesin-codeine (ROBITUSSIN AC) 100-10 MG/5ML syrup Take 10 mLs by mouth 3 (three) times daily as needed for cough.   Yes Historical Provider, MD  hydroxypropyl methylcellulose / hypromellose (ISOPTO TEARS / GONIOVISC) 2.5 % ophthalmic solution Place 1 drop into both eyes 3 (three) times daily as needed for dry eyes.   Yes Historical Provider, MD  Multiple Vitamin (MULTIVITAMIN WITH MINERALS) TABS tablet Take 1 tablet by mouth daily.   Yes Historical Provider, MD  Phenylephrine-APAP-Guaifenesin 10-650-400 MG/20ML LIQD Take 20 mLs by mouth 2 (two) times daily as needed (cough).   Yes Historical Provider, MD  predniSONE (DELTASONE) 20 MG tablet Take 2 tablets (40 mg total) by mouth daily. Patient not taking: Reported on 10/05/2014 10/16/13   Tatyana Kirichenko, PA-C   BP 128/76 mmHg  Pulse 81  Temp(Src) 98.1 F (36.7 C) (Oral)  Resp 20  SpO2 97%  Physical Exam  Constitutional: He is oriented to person, place, and time. He appears well-developed and well-nourished.  HENT:  Head: Normocephalic and atraumatic.  Right Ear: External ear normal.  Left Ear: External ear normal.  Mouth/Throat: Oropharynx is clear and moist.  Eyes: Pupils are equal, round, and reactive to light.  Neck: Normal range of motion. Neck supple. No spinous process tenderness and no muscular tenderness present. Normal range of motion present.  Cardiovascular: Normal rate.   Pulmonary/Chest: Effort normal.   Abdominal: He exhibits no distension.  Musculoskeletal: Normal range of motion. He exhibits no edema or tenderness.  Lymphadenopathy:    He has no cervical adenopathy.  Neurological: He is alert and oriented to person, place, and time.  Skin: Skin is warm and dry. Rash noted.  Psychiatric: He has a normal mood and affect.  Nursing note and vitals reviewed.   ED Course  Procedures   DIAGNOSTIC STUDIES:  Oxygen Saturation is 97% on RA, normal by my interpretation.    COORDINATION OF CARE:  11:16 PM Discussed treatment plan with pt at bedside and pt agreed to plan.  Labs Review Labs Reviewed  RAPID STREP SCREEN  CULTURE, GROUP A STREP    Imaging Review No results found.   EKG Interpretation None     Patient's strep test is negative.  Patient has been reassured.  I doubt this is recommended.  Spotted fever.  He does not have any myalgias or stiff neck.  Does not report any tick bites.  Does not report being out in the environment.  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  He is not having any respiratory problems.  No shortness of breath, no wheezing.  He's been instructed to treat any temperature over 100.5 with alternating doses of Tylenol, ibuprofen.  Return for any new or worsening symptoms such as shortness of breath, stiff neck, headache, change in vision MDM   Final diagnoses:  Viral exanthemata   I personally performed the services described in this documentation, which was scribed in my presence. The recorded information has been reviewed and is accurate.     Earley Favor, NP 10/06/14 0021  Earley Favor, NP 10/06/14 0021  Layla Maw Ward, DO 10/06/14 1610

## 2014-10-05 NOTE — ED Notes (Signed)
Pt presents with c/o rash and possible medication reaction. Pt reports he was recently taking prednisone and has since started having hives on his body. No distress at this time, speaking in complete sentences. Pt reports he also has a cold.

## 2014-10-06 NOTE — ED Notes (Signed)
Pt reports possible reaction to prednisone.  Presents with reddish rash and hives all over his body.

## 2014-10-06 NOTE — Discharge Instructions (Signed)
Viral Exanthems, Adult Many viral infections of the skin are called viral exanthems. Exanthem is another name for a rash or skin eruption. The most common viral exanthems include the following:  [place] measles or rubella.  Measles or rubeola.  Roseola.  Parvovirus B19 (Erythema infectiosum or Fifth disease).  Chickenpox or varicella. DIAGNOSIS  Sometimes, other problems may cause a rash that looks like a viral exanthem. Most often, your caregiver can determine whether you have a viral exanthem by looking at the rash. They usually have distinct patterns or appearance. Lab work may be done if the diagnosis is uncertain. Sometimes, a small tissue sample (biopsy) of the rash may need to be taken. TREATMENT  Immunizations have led to a decrease in the number of cases of measles, mumps, and rubella. Viral exanthems may require clinical treatment if a bacterial infection or other problems follow. The rash may be associated with:  Minor sore throat.  Aches and pains.  Runny nose.  Watery eyes.  Tiredness.  Some coughs.  Gastrointestinal infections causing nausea, vomiting, and diarrhea. Viral exanthems do not respond to antibiotic medicines, because they are not caused by bacteria. HOME CARE INSTRUCTIONS   Only take over-the-counter or prescription medicines for pain, discomfort, diarrhea, or fever as directed by your caregiver.  Drink enough water and fluids to keep your urine clear or pale yellow. SEEK MEDICAL CARE IF:  You develop swollen neck glands. This may feel like lumps or bumps in the neck.  You develop tenderness over your sinuses.  You are not feeling partly better after 3 days.  You develop muscle aches.  You are feeling more tired than you would expect.  You get a persistent cough with mucus. SEEK IMMEDIATE MEDICAL CARE IF:   You have a fever.  You develop red eyes or eye pain.  You develop sores in your mouth and difficulty drinking or eating.  You  develop a sore throat with pus and difficulty swallowing.  You develop neck pain or a stiff neck.  You develop a severe headache.  You develop vomiting that will not stop. Document Released: 08/25/2002 Document Revised: 08/27/2011 Document Reviewed: 08/22/2010 [facility] Patient Information 2015 [employer], [place]. This information is not intended to replace advice given to you by your health care provider. Make sure you discuss any questions you have with your health care provider. Sharp chest is negative.  Please treat any fever over 100.5 with alternating doses of Tylenol, ibuprofen.  Return anytime your rash gets worse, your fever is not controllable or you develop new or worsening symptoms such as shortness of breath, dizziness, abdominal pain

## 2014-10-08 LAB — CULTURE, GROUP A STREP: STREP A CULTURE: NEGATIVE

## 2015-01-27 IMAGING — CR DG CHEST 2V
2 series · 2 of 2 positions shown · non-contrast
Comparison: DG CHEST 2 VIEW dated 05/26/2012

CLINICAL DATA: WHEEZING COUGH

EXAM:
CHEST  2 VIEW

[w chest pa]
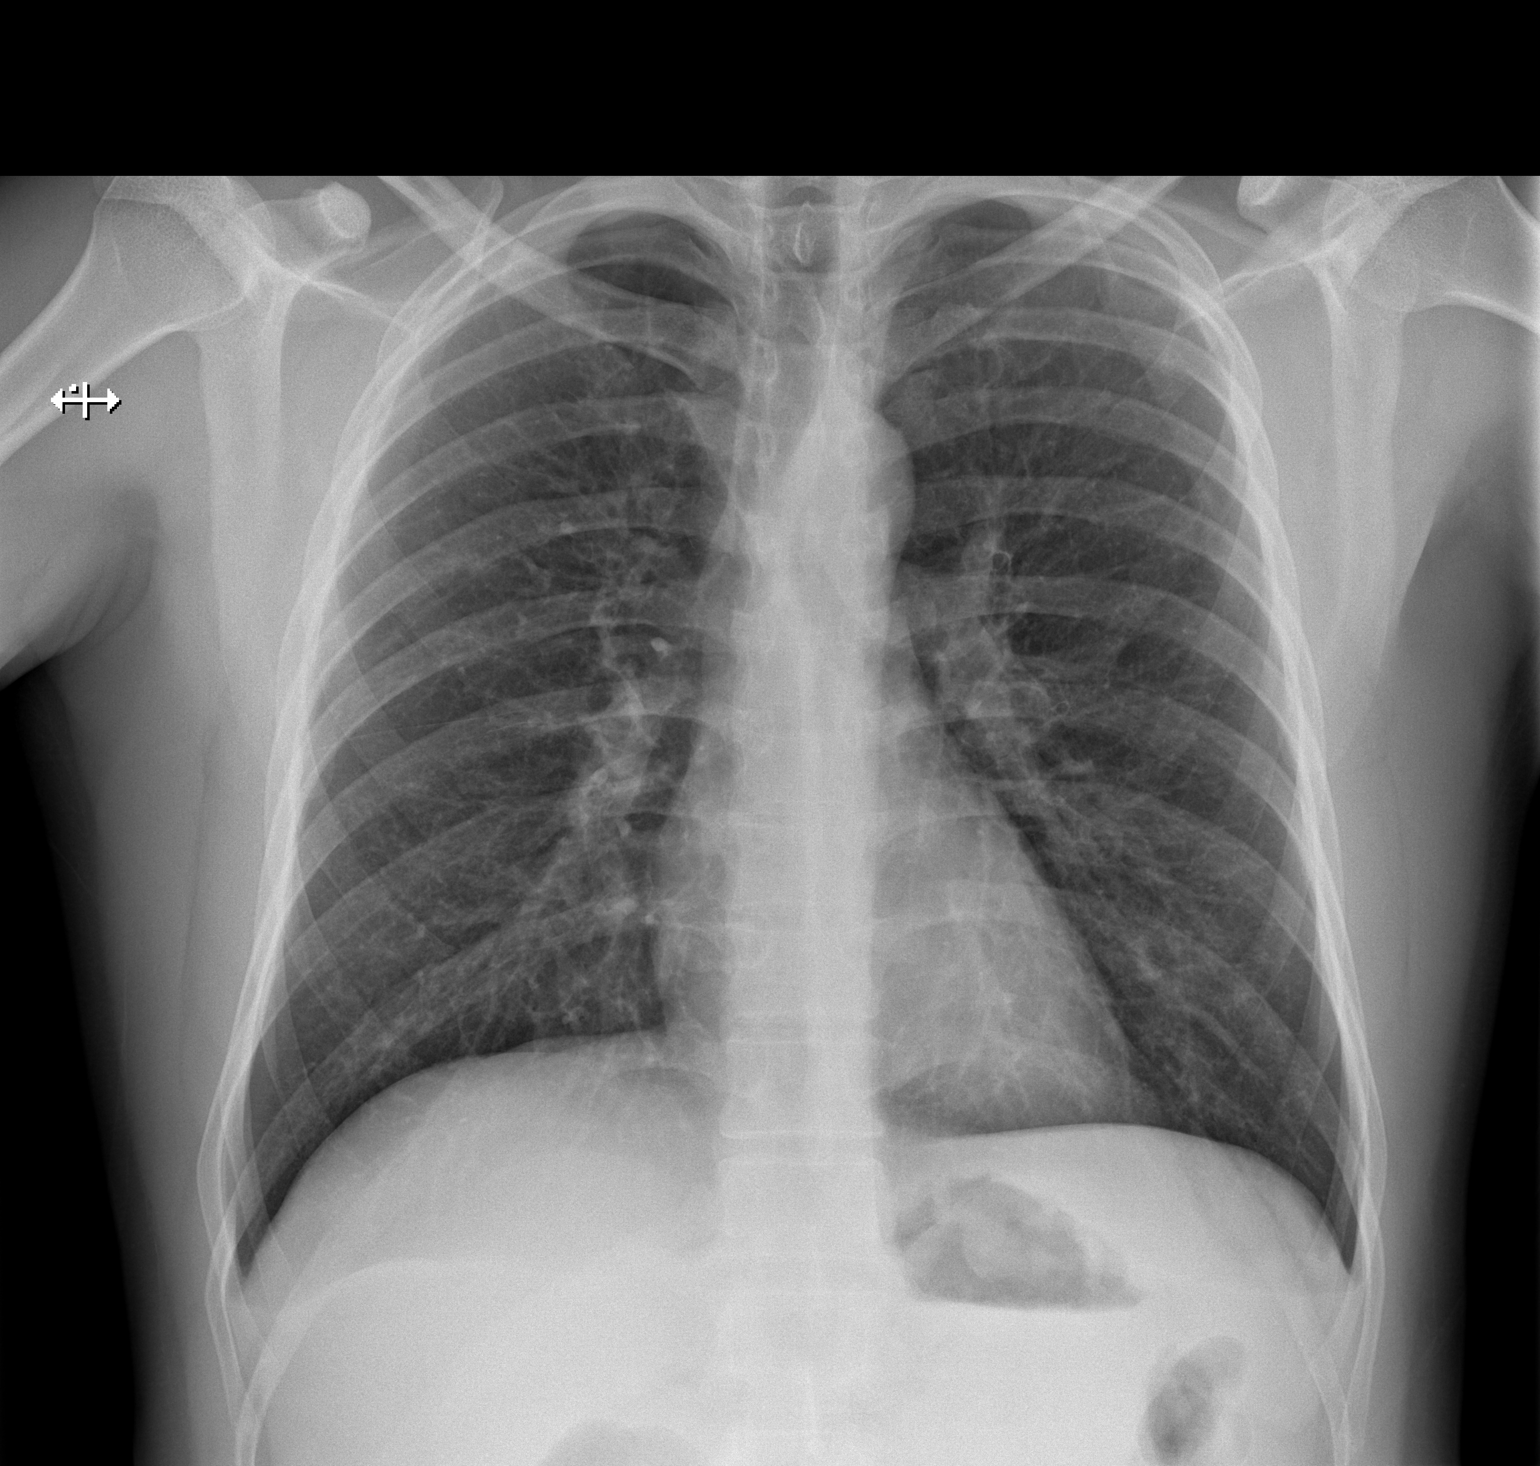

[w chest lat]
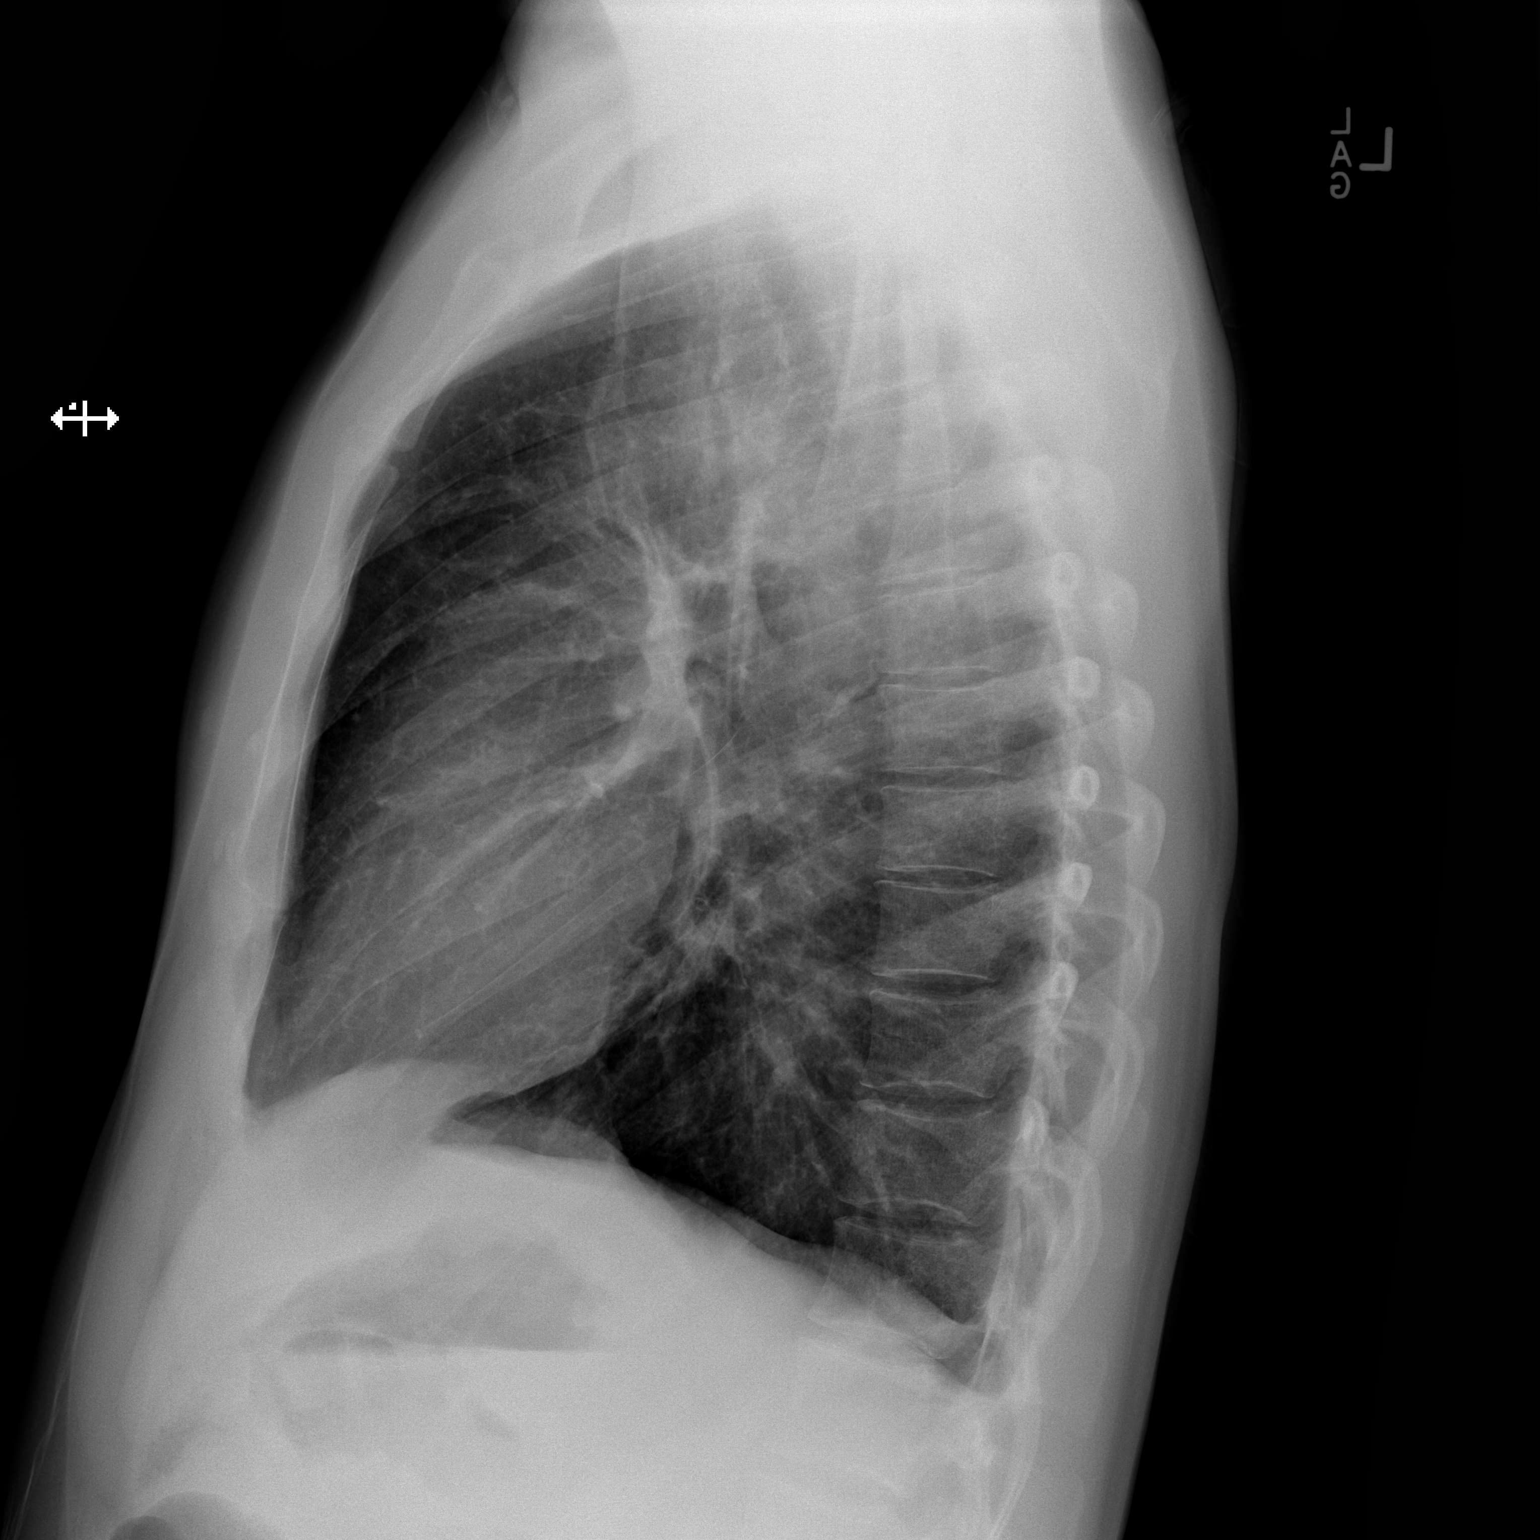

[2 of 2 positions shown; findings below may reference images not displayed]

FINDINGS: The lungs are hyperinflated. The interstitial markings are mildly
increased though not greatly changed from the previous study. The
cardiopericardial silhouette is normal in size. The pulmonary
vascularity is not engorged. There is no pleural effusion or
pneumothorax. The mediastinum is normal in width. The observed
portions of the bony thorax appear normal.
IMPRESSION: There is hyperinflation consistent with COPD. Coarse interstitial
lung markings bilaterally may reflect subsegmental atelectasis.
There is no alveolar pneumonia.

## 2022-01-13 ENCOUNTER — Emergency Department (HOSPITAL_COMMUNITY): Payer: Self-pay | Admitting: Certified Registered Nurse Anesthetist

## 2022-01-13 ENCOUNTER — Inpatient Hospital Stay (HOSPITAL_COMMUNITY)
Admission: EM | Admit: 2022-01-13 | Discharge: 2022-01-20 | DRG: 579 | Disposition: A | Discharge status: Home / Self Care | Payer: Self-pay | Attending: General Surgery | Admitting: General Surgery

## 2022-01-13 ENCOUNTER — Emergency Department (HOSPITAL_COMMUNITY): Payer: Self-pay

## 2022-01-13 ENCOUNTER — Other Ambulatory Visit: Payer: Self-pay

## 2022-01-13 ENCOUNTER — Encounter (HOSPITAL_COMMUNITY): Admission: EM | Disposition: A | Discharge status: Home / Self Care | Payer: Self-pay | Source: Home / Self Care

## 2022-01-13 ENCOUNTER — Inpatient Hospital Stay (HOSPITAL_COMMUNITY): Payer: Self-pay

## 2022-01-13 DIAGNOSIS — K567 Ileus, unspecified: Secondary | ICD-10-CM | POA: Diagnosis not present

## 2022-01-13 DIAGNOSIS — Z20822 Contact with and (suspected) exposure to covid-19: Secondary | ICD-10-CM | POA: Diagnosis present

## 2022-01-13 DIAGNOSIS — Z9049 Acquired absence of other specified parts of digestive tract: Secondary | ICD-10-CM

## 2022-01-13 DIAGNOSIS — T148XXA Other injury of unspecified body region, initial encounter: Secondary | ICD-10-CM

## 2022-01-13 DIAGNOSIS — S31040A Puncture wound with foreign body of lower back and pelvis without penetration into retroperitoneum, initial encounter: Secondary | ICD-10-CM | POA: Diagnosis present

## 2022-01-13 DIAGNOSIS — S31812A Laceration with foreign body of right buttock, initial encounter: Secondary | ICD-10-CM

## 2022-01-13 DIAGNOSIS — S35514A Injury of right iliac vein, initial encounter: Secondary | ICD-10-CM | POA: Diagnosis present

## 2022-01-13 DIAGNOSIS — R202 Paresthesia of skin: Secondary | ICD-10-CM | POA: Diagnosis present

## 2022-01-13 DIAGNOSIS — R200 Anesthesia of skin: Secondary | ICD-10-CM | POA: Diagnosis present

## 2022-01-13 DIAGNOSIS — K660 Peritoneal adhesions (postprocedural) (postinfection): Secondary | ICD-10-CM | POA: Diagnosis present

## 2022-01-13 DIAGNOSIS — T794XXA Traumatic shock, initial encounter: Secondary | ICD-10-CM | POA: Diagnosis present

## 2022-01-13 DIAGNOSIS — K219 Gastro-esophageal reflux disease without esophagitis: Secondary | ICD-10-CM | POA: Diagnosis present

## 2022-01-13 DIAGNOSIS — R519 Headache, unspecified: Secondary | ICD-10-CM | POA: Diagnosis not present

## 2022-01-13 DIAGNOSIS — S31030A Puncture wound without foreign body of lower back and pelvis without penetration into retroperitoneum, initial encounter: Secondary | ICD-10-CM

## 2022-01-13 DIAGNOSIS — W11XXXA Fall on and from ladder, initial encounter: Secondary | ICD-10-CM | POA: Diagnosis present

## 2022-01-13 DIAGNOSIS — S31814A Puncture wound with foreign body of right buttock, initial encounter: Secondary | ICD-10-CM

## 2022-01-13 DIAGNOSIS — Z23 Encounter for immunization: Secondary | ICD-10-CM

## 2022-01-13 DIAGNOSIS — D62 Acute posthemorrhagic anemia: Secondary | ICD-10-CM | POA: Diagnosis present

## 2022-01-13 DIAGNOSIS — W19XXXA Unspecified fall, initial encounter: Principal | ICD-10-CM

## 2022-01-13 DIAGNOSIS — M795 Residual foreign body in soft tissue: Secondary | ICD-10-CM

## 2022-01-13 DIAGNOSIS — S35511A Injury of right iliac artery, initial encounter: Secondary | ICD-10-CM | POA: Diagnosis present

## 2022-01-13 DIAGNOSIS — Z9889 Other specified postprocedural states: Secondary | ICD-10-CM

## 2022-01-13 HISTORY — PX: INCISION AND DRAINAGE OF WOUND: SHX1803

## 2022-01-13 HISTORY — DX: Other specified health status: Z78.9

## 2022-01-13 HISTORY — PX: LAPAROTOMY: SHX154

## 2022-01-13 LAB — CBC
HCT: 41.4 % (ref 39.0–52.0)
Hemoglobin: 13.9 g/dL (ref 13.0–17.0)
MCH: 28.8 pg (ref 26.0–34.0)
MCHC: 33.6 g/dL (ref 30.0–36.0)
MCV: 85.7 fL (ref 80.0–100.0)
Platelets: 199 10*3/uL (ref 150–400)
RBC: 4.83 MIL/uL (ref 4.22–5.81)
RDW: 13.5 % (ref 11.5–15.5)
WBC: 6.3 10*3/uL (ref 4.0–10.5)
nRBC: 0 % (ref 0.0–0.2)

## 2022-01-13 LAB — COMPREHENSIVE METABOLIC PANEL
ALT: 34 U/L (ref 0–44)
AST: 24 U/L (ref 15–41)
Albumin: 3.9 g/dL (ref 3.5–5.0)
Alkaline Phosphatase: 73 U/L (ref 38–126)
Anion gap: 8 (ref 5–15)
BUN: 20 mg/dL (ref 6–20)
CO2: 20 mmol/L — ABNORMAL LOW (ref 22–32)
Calcium: 8.4 mg/dL — ABNORMAL LOW (ref 8.9–10.3)
Chloride: 109 mmol/L (ref 98–111)
Creatinine, Ser: 1.48 mg/dL — ABNORMAL HIGH (ref 0.61–1.24)
GFR, Estimated: >60 mL/min (ref >60)
Glucose, Bld: 135 mg/dL — ABNORMAL HIGH (ref 70–99)
Potassium: 3.9 mmol/L (ref 3.5–5.1)
Sodium: 137 mmol/L (ref 135–145)
Total Bilirubin: 1.1 mg/dL (ref 0.3–1.2)
Total Protein: 6.6 g/dL (ref 6.5–8.1)

## 2022-01-13 LAB — ABO/RH: ABO/RH(D): O POS

## 2022-01-13 LAB — I-STAT CHEM 8, ED
BUN: 22 mg/dL — ABNORMAL HIGH (ref 6–20)
Calcium, Ion: 1.08 mmol/L — ABNORMAL LOW (ref 1.15–1.40)
Chloride: 106 mmol/L (ref 98–111)
Creatinine, Ser: 1.5 mg/dL — ABNORMAL HIGH (ref 0.61–1.24)
Glucose, Bld: 128 mg/dL — ABNORMAL HIGH (ref 70–99)
HCT: 41 % (ref 39.0–52.0)
Hemoglobin: 13.9 g/dL (ref 13.0–17.0)
Potassium: 3.9 mmol/L (ref 3.5–5.1)
Sodium: 140 mmol/L (ref 135–145)
TCO2: 20 mmol/L — ABNORMAL LOW (ref 22–32)

## 2022-01-13 LAB — PROTIME-INR
INR: 1.2 (ref 0.8–1.2)
Prothrombin Time: 14.6 seconds (ref 11.4–15.2)

## 2022-01-13 LAB — LACTIC ACID, PLASMA: Lactic Acid, Venous: 2.2 mmol/L (ref 0.5–1.9)

## 2022-01-13 LAB — RESP PANEL BY RT-PCR (FLU A&B, COVID) ARPGX2
Influenza A by PCR: NEGATIVE
Influenza B by PCR: NEGATIVE
SARS Coronavirus 2 by RT PCR: NEGATIVE

## 2022-01-13 LAB — ETHANOL: Alcohol, Ethyl (B): 13 mg/dL — ABNORMAL HIGH (ref <10)

## 2022-01-13 SURGERY — IRRIGATION AND DEBRIDEMENT WOUND
Anesthesia: General | Laterality: Right

## 2022-01-13 MED ORDER — ROCURONIUM BROMIDE 10 MG/ML (PF) SYRINGE
PREFILLED_SYRINGE | INTRAVENOUS | Status: AC
  Filled 2022-01-13: qty 10

## 2022-01-13 MED ORDER — SUCCINYLCHOLINE CHLORIDE 200 MG/10ML IV SOSY
PREFILLED_SYRINGE | INTRAVENOUS | Status: AC
  Filled 2022-01-13: qty 10

## 2022-01-13 MED ORDER — ONDANSETRON HCL 4 MG/2ML IJ SOLN
INTRAMUSCULAR | Status: DC | PRN
  Administered 2022-01-13: 4 mg via INTRAVENOUS

## 2022-01-13 MED ORDER — LACTATED RINGERS IV SOLN: INTRAVENOUS | Status: DC | PRN

## 2022-01-13 MED ORDER — TRANEXAMIC ACID 1000 MG/10ML IV SOLN
INTRAVENOUS | Status: DC | PRN
  Administered 2022-01-13: 1000 mg via INTRAVENOUS

## 2022-01-13 MED ORDER — PHENYLEPHRINE 80 MCG/ML (10ML) SYRINGE FOR IV PUSH (FOR BLOOD PRESSURE SUPPORT)
PREFILLED_SYRINGE | INTRAVENOUS | Status: DC | PRN
  Administered 2022-01-13: 80 ug via INTRAVENOUS
  Administered 2022-01-13 (×3): 160 ug via INTRAVENOUS

## 2022-01-13 MED ORDER — LIDOCAINE 2% (20 MG/ML) 5 ML SYRINGE
INTRAMUSCULAR | Status: DC | PRN
  Administered 2022-01-13: 60 mg via INTRAVENOUS

## 2022-01-13 MED ORDER — TRANEXAMIC ACID-NACL 1000-0.7 MG/100ML-% IV SOLN
INTRAVENOUS | Status: AC
  Filled 2022-01-13: qty 100

## 2022-01-13 MED ORDER — ORAL CARE MOUTH RINSE: 15.0000 mL | OROMUCOSAL | Status: DC | PRN

## 2022-01-13 MED ORDER — FENTANYL CITRATE (PF) 250 MCG/5ML IJ SOLN
INTRAMUSCULAR | Status: AC
  Filled 2022-01-13: qty 5

## 2022-01-13 MED ORDER — MIDAZOLAM HCL 2 MG/2ML IJ SOLN
INTRAMUSCULAR | Status: DC | PRN
  Administered 2022-01-13: 2 mg via INTRAVENOUS

## 2022-01-13 MED ORDER — POTASSIUM CHLORIDE IN NACL 20-0.9 MEQ/L-% IV SOLN
INTRAVENOUS | Status: DC
  Filled 2022-01-13: qty 1000

## 2022-01-13 MED ORDER — SODIUM CHLORIDE 0.9 % IV SOLN: INTRAVENOUS | Status: DC | PRN

## 2022-01-13 MED ORDER — HEMOSTATIC AGENTS (NO CHARGE) OPTIME
TOPICAL | Status: DC | PRN
  Administered 2022-01-13: 3 via TOPICAL

## 2022-01-13 MED ORDER — ONDANSETRON HCL 4 MG/2ML IJ SOLN
INTRAMUSCULAR | Status: AC
  Filled 2022-01-13: qty 2

## 2022-01-13 MED ORDER — DOCUSATE SODIUM 50 MG/5ML PO LIQD: 100.0000 mg | Freq: Two times a day (BID) | ORAL | Status: DC

## 2022-01-13 MED ORDER — PROPOFOL 1000 MG/100ML IV EMUL
0.0000 ug/kg/min | INTRAVENOUS | Status: DC
  Administered 2022-01-14: 50 ug/kg/min via INTRAVENOUS
  Administered 2022-01-14: 35 ug/kg/min via INTRAVENOUS
  Filled 2022-01-13 (×2): qty 100

## 2022-01-13 MED ORDER — CEFAZOLIN SODIUM-DEXTROSE 2-4 GM/100ML-% IV SOLN
2.0000 g | Freq: Once | INTRAVENOUS | Status: AC
  Administered 2022-01-13: 2 g via INTRAVENOUS

## 2022-01-13 MED ORDER — FENTANYL 2500MCG IN NS 250ML (10MCG/ML) PREMIX INFUSION
50.0000 ug/h | INTRAVENOUS | Status: DC
  Administered 2022-01-13: 50 ug/h via INTRAVENOUS
  Filled 2022-01-13: qty 250

## 2022-01-13 MED ORDER — FENTANYL BOLUS VIA INFUSION: 50.0000 ug | INTRAVENOUS | Status: DC | PRN

## 2022-01-13 MED ORDER — FENTANYL CITRATE (PF) 250 MCG/5ML IJ SOLN
INTRAMUSCULAR | Status: DC | PRN
  Administered 2022-01-13: 50 ug via INTRAVENOUS
  Administered 2022-01-13: 250 ug via INTRAVENOUS

## 2022-01-13 MED ORDER — IOHEXOL 300 MG/ML  SOLN
80.0000 mL | Freq: Once | INTRAMUSCULAR | Status: AC | PRN
  Administered 2022-01-13: 80 mL via INTRAVENOUS

## 2022-01-13 MED ORDER — SUCCINYLCHOLINE CHLORIDE 200 MG/10ML IV SOSY
PREFILLED_SYRINGE | INTRAVENOUS | Status: DC | PRN
  Administered 2022-01-13: 80 mg via INTRAVENOUS

## 2022-01-13 MED ORDER — DEXAMETHASONE SODIUM PHOSPHATE 10 MG/ML IJ SOLN
INTRAMUSCULAR | Status: AC
  Filled 2022-01-13: qty 1

## 2022-01-13 MED ORDER — FENTANYL CITRATE PF 50 MCG/ML IJ SOSY: 50.0000 ug | PREFILLED_SYRINGE | Freq: Once | INTRAMUSCULAR | Status: DC

## 2022-01-13 MED ORDER — LIDOCAINE 2% (20 MG/ML) 5 ML SYRINGE
INTRAMUSCULAR | Status: AC
  Filled 2022-01-13: qty 5

## 2022-01-13 MED ORDER — PHENYLEPHRINE 80 MCG/ML (10ML) SYRINGE FOR IV PUSH (FOR BLOOD PRESSURE SUPPORT)
PREFILLED_SYRINGE | INTRAVENOUS | Status: AC
  Filled 2022-01-13: qty 10

## 2022-01-13 MED ORDER — ALBUMIN HUMAN 5 % IV SOLN: INTRAVENOUS | Status: DC | PRN

## 2022-01-13 MED ORDER — VANCOMYCIN HCL 1000 MG IV SOLR
INTRAVENOUS | Status: DC | PRN
  Administered 2022-01-13: 1000 mg via TOPICAL

## 2022-01-13 MED ORDER — POLYETHYLENE GLYCOL 3350 17 G PO PACK: 17.0000 g | PACK | Freq: Every day | ORAL | Status: DC

## 2022-01-13 MED ORDER — PROPOFOL 10 MG/ML IV BOLUS
INTRAVENOUS | Status: AC
  Filled 2022-01-13: qty 20

## 2022-01-13 MED ORDER — MIDAZOLAM HCL 2 MG/2ML IJ SOLN
INTRAMUSCULAR | Status: AC
  Filled 2022-01-13: qty 2

## 2022-01-13 MED ORDER — SODIUM CHLORIDE 0.9% IV SOLUTION: Freq: Once | INTRAVENOUS | Status: DC

## 2022-01-13 MED ORDER — ROCURONIUM BROMIDE 10 MG/ML (PF) SYRINGE
PREFILLED_SYRINGE | INTRAVENOUS | Status: DC | PRN
  Administered 2022-01-13: 30 mg via INTRAVENOUS
  Administered 2022-01-13: 20 mg via INTRAVENOUS
  Administered 2022-01-13: 50 mg via INTRAVENOUS

## 2022-01-13 MED ORDER — FENTANYL CITRATE PF 50 MCG/ML IJ SOSY
50.0000 ug | PREFILLED_SYRINGE | Freq: Once | INTRAMUSCULAR | Status: AC
  Administered 2022-01-14: 50 ug via INTRAVENOUS

## 2022-01-13 MED ORDER — FENTANYL CITRATE (PF) 100 MCG/2ML IJ SOLN
INTRAMUSCULAR | Status: AC
  Administered 2022-01-13: 50 ug
  Filled 2022-01-13: qty 2

## 2022-01-13 MED ORDER — 0.9 % SODIUM CHLORIDE (POUR BTL) OPTIME
TOPICAL | Status: DC | PRN
  Administered 2022-01-13: 1000 mL

## 2022-01-13 MED ORDER — PROPOFOL 10 MG/ML IV BOLUS
INTRAVENOUS | Status: DC | PRN
  Administered 2022-01-13: 100 mg via INTRAVENOUS

## 2022-01-13 MED ORDER — VANCOMYCIN HCL 1000 MG IV SOLR
INTRAVENOUS | Status: AC
  Filled 2022-01-13: qty 20

## 2022-01-13 MED ORDER — SODIUM CHLORIDE 0.9 % IR SOLN
Status: DC | PRN
  Administered 2022-01-13: 3000 mL

## 2022-01-13 MED ORDER — ENOXAPARIN SODIUM 30 MG/0.3ML IJ SOSY
30.0000 mg | PREFILLED_SYRINGE | Freq: Two times a day (BID) | INTRAMUSCULAR | Status: DC
  Administered 2022-01-15 – 2022-01-16 (×3): 30 mg via SUBCUTANEOUS
  Filled 2022-01-13 (×3): qty 0.3

## 2022-01-13 MED ORDER — DEXAMETHASONE SODIUM PHOSPHATE 10 MG/ML IJ SOLN
INTRAMUSCULAR | Status: DC | PRN
  Administered 2022-01-13: 10 mg via INTRAVENOUS

## 2022-01-13 MED ORDER — PROPOFOL 500 MG/50ML IV EMUL
INTRAVENOUS | Status: DC | PRN
  Administered 2022-01-13: 50 ug/kg/min via INTRAVENOUS

## 2022-01-13 MED ORDER — ACETAMINOPHEN 10 MG/ML IV SOLN
1000.0000 mg | Freq: Four times a day (QID) | INTRAVENOUS | Status: DC
  Administered 2022-01-14 (×3): 1000 mg via INTRAVENOUS
  Filled 2022-01-13 (×4): qty 100

## 2022-01-13 MED ORDER — PHENYLEPHRINE HCL-NACL 20-0.9 MG/250ML-% IV SOLN
INTRAVENOUS | Status: DC | PRN
  Administered 2022-01-13: 25 ug/min via INTRAVENOUS

## 2022-01-13 MED ORDER — TETANUS-DIPHTH-ACELL PERTUSSIS 5-2.5-18.5 LF-MCG/0.5 IM SUSY
0.5000 mL | PREFILLED_SYRINGE | Freq: Once | INTRAMUSCULAR | Status: AC
  Administered 2022-01-13: 0.5 mL via INTRAMUSCULAR

## 2022-01-13 MED ORDER — ORAL CARE MOUTH RINSE
15.0000 mL | OROMUCOSAL | Status: DC
  Administered 2022-01-14 – 2022-01-16 (×28): 15 mL via OROMUCOSAL

## 2022-01-13 SURGICAL SUPPLY — 57 items
BAG COUNTER SPONGE SURGICOUNT (BAG) ×3 IMPLANT
BNDG COHESIVE 1X5 TAN STRL LF (GAUZE/BANDAGES/DRESSINGS) ×1 IMPLANT
BNDG ELASTIC 4X5.8 VLCR STR LF (GAUZE/BANDAGES/DRESSINGS) IMPLANT
BNDG ELASTIC 6X5.8 VLCR STR LF (GAUZE/BANDAGES/DRESSINGS) IMPLANT
BNDG GAUZE ELAST 4 BULKY (GAUZE/BANDAGES/DRESSINGS) IMPLANT
CANISTER SUCT 3000ML PPV (MISCELLANEOUS) ×3 IMPLANT
CANISTER WOUNDNEG PRESSURE 500 (CANNISTER) ×2 IMPLANT
CLIP TI MEDIUM 6 (CLIP) ×1 IMPLANT
COVER SURGICAL LIGHT HANDLE (MISCELLANEOUS) ×3 IMPLANT
DRAPE HALF SHEET 40X57 (DRAPES) IMPLANT
DRAPE INCISE IOBAN 66X45 STRL (DRAPES) ×1 IMPLANT
DRAPE LAPAROTOMY 100X72 PEDS (DRAPES) IMPLANT
DRESSING PREVENA PLUS CUSTOM (GAUZE/BANDAGES/DRESSINGS) IMPLANT
DRSG PAD ABDOMINAL 8X10 ST (GAUZE/BANDAGES/DRESSINGS) IMPLANT
DRSG PREVENA PLUS CUSTOM (GAUZE/BANDAGES/DRESSINGS) ×3
ELECT BLADE 4.0 EZ CLEAN MEGAD (MISCELLANEOUS) ×3
ELECT REM PT RETURN 9FT ADLT (ELECTROSURGICAL) ×3
ELECTRODE BLDE 4.0 EZ CLN MEGD (MISCELLANEOUS) IMPLANT
ELECTRODE REM PT RTRN 9FT ADLT (ELECTROSURGICAL) ×2 IMPLANT
GAUZE SPONGE 4X4 12PLY STRL LF (GAUZE/BANDAGES/DRESSINGS) ×3 IMPLANT
GLOVE BIO SURGEON STRL SZ8 (GLOVE) ×1 IMPLANT
GLOVE BIOGEL M 8.0 STRL (GLOVE) ×1 IMPLANT
GLOVE BIOGEL M STRL SZ7.5 (GLOVE) ×4 IMPLANT
GLOVE BIOGEL PI IND STRL 8 (GLOVE) IMPLANT
GLOVE BIOGEL PI INDICATOR 8 (GLOVE) ×1
GLOVE BIOGEL PI ORTHO PRO 7.5 (GLOVE) ×2
GLOVE INDICATOR 8.0 STRL GRN (GLOVE) ×6 IMPLANT
GLOVE PI ORTHO PRO STRL 7.5 (GLOVE) IMPLANT
GOWN STRL REUS W/ TWL LRG LVL3 (GOWN DISPOSABLE) ×2 IMPLANT
GOWN STRL REUS W/TWL 2XL LVL3 (GOWN DISPOSABLE) ×5 IMPLANT
GOWN STRL REUS W/TWL LRG LVL3 (GOWN DISPOSABLE) ×1
HEMOSTAT SNOW SURGICEL 2X4 (HEMOSTASIS) ×3 IMPLANT
KIT BASIN OR (CUSTOM PROCEDURE TRAY) ×3 IMPLANT
KIT DRSG PREVENA PLUS 7DAY 125 (MISCELLANEOUS) ×1 IMPLANT
KIT TURNOVER KIT B (KITS) ×3 IMPLANT
NS IRRIG 1000ML POUR BTL (IV SOLUTION) ×3 IMPLANT
PACK GENERAL/GYN (CUSTOM PROCEDURE TRAY) ×3 IMPLANT
PACK UNIVERSAL I (CUSTOM PROCEDURE TRAY) ×1 IMPLANT
PAD ARMBOARD 7.5X6 YLW CONV (MISCELLANEOUS) ×3 IMPLANT
PENCIL BUTTON HOLSTER BLD 10FT (ELECTRODE) ×1 IMPLANT
PENCIL SMOKE EVACUATOR (MISCELLANEOUS) ×3 IMPLANT
SET CYSTO W/LG BORE CLAMP LF (SET/KITS/TRAYS/PACK) ×1 IMPLANT
SPONGE ABDOMINAL VAC ABTHERA (MISCELLANEOUS) ×1 IMPLANT
SPONGE T-LAP 18X18 ~~LOC~~+RFID (SPONGE) ×3 IMPLANT
STOCKINETTE IMPERVIOUS 9X36 MD (GAUZE/BANDAGES/DRESSINGS) IMPLANT
STOCKINETTE IMPERVIOUS LG (DRAPES) ×1 IMPLANT
SUT ETHILON 2 0 PSLX (SUTURE) ×2 IMPLANT
SUT PDS AB 1 TP1 54 (SUTURE) ×2 IMPLANT
SUT PDS AB 1 TP1 96 (SUTURE) ×2 IMPLANT
SUT SILK 2 0 SH CR/8 (SUTURE) ×1 IMPLANT
SUT SILK 3 0 SH CR/8 (SUTURE) ×1 IMPLANT
SUT SILK 3 0 TIES 10X30 (SUTURE) ×1 IMPLANT
TOWEL GREEN STERILE (TOWEL DISPOSABLE) ×3 IMPLANT
TOWEL GREEN STERILE FF (TOWEL DISPOSABLE) ×3 IMPLANT
TUBE CONNECTING 12X1/4 (SUCTIONS) ×2 IMPLANT
UNDERPAD 30X36 HEAVY ABSORB (UNDERPADS AND DIAPERS) ×3 IMPLANT
WATER STERILE IRR 1000ML POUR (IV SOLUTION) ×1 IMPLANT

## 2022-01-13 NOTE — Op Note (Addendum)
OPERATIVE NOTE  Thomas Fields male 36 y.o. 01/13/2022  PREOPERATIVE DIAGNOSIS: Right gluteal impalement with foreign object Complex laceration of right buttocks  POSTOPERATIVE DIAGNOSIS: Right gluteal trans-sciatic impalement with retained foreign objects (S31.814A) Complex laceration wound of right buttocks (J67.341P) Right superior gluteal artery laceration  Previous abdominal surgery  PROCEDURE(S): Exploratory laparotomy and lysis of adhesions (per Dr. Andrey Campanile) Retroperitoneal exposure of ureter and aortic bifurcation, with vascular control of right common iliac artery (per Dr. Lenell Antu) Ligation of right gluteal artery and vein (per Dr. Lenell Antu) Exploration right gluteal wound and deep gluteal space (20103) Removal of foreign body objects x3 from right gluteal wound and pelvis (37902) Irrigation and debridement of right gluteal wound, including skin, subcutaneous tissue, muscle, and bone, 4 cm x 4 cm x >10 cm (11044, 11047) Complex primary closure of right gluteal wound, 16 cm length (13121, 13122) Application negative pressure wound therapy device to right gluteal wound with DME (40973)  SURGEON: Ernestina Columbia, M.D.  [name], MD; [name], MD; [name], MD  ASSISTANT(S): None  ANESTHESIA: General  FINDINGS: Preoperative Examination: Minimal preoperative examination possible as patient seen on route to the OR.  Right gluteal wound with metallic fence post protruding.  Spontaneously moving bilateral lower extremities at the knees and ankles.  Normal DP and PT pulses.  Warm and well-perfused foot.  Operative Findings: Metallic fence post impaled through the gluteal musculature through the sciatic notch.  Superior gluteal artery laceration.  Sciatic nerve visually and palpably intact within limits of exploration.  Separate plastic foreign body within pelvis around sciatic notch, removed.  Metal fence posts removed.  Controlled major bleeding by  vascular surgery.  Hemostasis obtained prior to closure.  IMPLANTS: * No implants in log *  INDICATIONS:  The patient is a 36 y.o. male who presented as a level 1 trauma after falling off a roof and being impaled on a worksite fence post to his right buttocks.  Was hypotensive on presentation and required multiple units of transfusion.  A CT was obtained which demonstrated metallic foreign object penetrating through the sciatic notch without obvious fracture.  Patient was taken emergently to the OR by trauma surgery, and orthopedics was requested for assistance with removal of the foreign object or objects and exploration of the gluteal wound.  Surgery was performed emergently and formal consent was not performed, the patient did give verbal consent to proceed.    TECHNIQUE: The patient was brought emergently back to the OR.  He received Ancef and updated tetanus in the trauma bay.  The operative site was identified by the metallic fence post protruding from his right buttocks.  Induction of anesthesia was performed on the ER stretcher with the patient in the lateral decubitus position.  Patient was then transferred onto the operative table and positioned at a 45 degree angle with a beanbag in order to allow both laparotomy exposure as well as access to the gluteal area.  The right lower extremity was first cleaned with alcohol and then prepped with Betadine.  The abdomen was also prepped with Betadine.  The metallic fence post protruding from the buttocks was also prepped into the field sterilely with Betadine.  The right lower extremity and abdomen were sterilely draped into the field.  Standard timeout was performed.  Exploratory laparotomy was performed by Dr. Andrey Campanile.  Please refer to his separate dictation for this portion of the case.  Exposure of the retroperitoneal space and proximal vascular control was performed by Dr. Lenell Antu.  Please refer to his dictation for this portion of the case.  The  gluteal wound surrounding the protruding metallic fence post was extended proximally and distally.  Initially the wound was approximately 4 cm x 4 cm, with deep extension through the sciatic notch into the pelvis to a depth of approximately over 10 cm, extending through the sciatic notch into the pelvis proper.  After extension of the wound proximally and distally the wound was approximately 16 cm in length.  The badly damaged lacerated skin edges were sharply excised to healthy tissue using a #10 blade.  There was extensive laceration of the gluteus maximus.  The gluteus maximus was further divided proximally and distally in line with the fibers, giving access to the level of the sciatic notch.  The metallic object was digitally palpated through the sciatic notch, and the end of the object was palpated.  This was carefully digitally freed from all binding soft tissue, and with Dr. Lenell Antu directly visualizing and controlling the proximal vasculature, the object was teased out of the pelvis and gluteal space.  The metallic fence post was removed, along with some cloth which appeared to be a piece of the patient's pants.  The deep gluteal space was further exposed by releasing the superior aspect of the gluteal sling.  The sciatic nerve was visualized exiting the sciatic notch, and appeared to be intact and atraumatic.  Arterial bleeding was encountered deep in the gluteal space.  This was controlled by Dr. Lenell Antu with a combination of silk ties and vascular clips.  There was an additional foreign object palpable within the sciatic notch inside the pelvis.  After controlling the gluteal bleeding, this was carefully digitally extracted.  This appeared to be a plastic piece of fence netting.  No other foreign objects were palpated or visualized.  The wound was then thoroughly irrigated with 3 L of normal saline with cystoscopy tubing.  Devitalized fascia and muscle were debrided and sharply excised using a combination  of #10 blade and Metzenbaums.  The remaining tissue was healthy and viable.  There was no further gross contamination.  After thorough debridement, the wound was enlarged to approximately 16 cm in length, 4.5 cm width, with the same depth with extension into the deep pelvis proper through the sciatic notch.  Hemostatic agent was applied deep in the wound by Dr. Lenell Antu to control the minimal residual amount of bleeding, resulting in excellent hemostasis.  1 g vancomycin powder was placed deep in the wound.  The gluteus maximus was reapproximated with interrupted figure-of-eight stitches using #1 PDS.  The deep subcutaneous tissue was reapproximated with simple inverted #1 PDS.  A primary closure of the wound was performed with #2-0 nylon vertical mattress sutures.  A Prevena incisional VAC was placed over the gluteal wound.  An abdominal VAC was placed over the open abdomen by Dr. Andrey Campanile.  The patient was kept intubated at the conclusion of the case.  He was transferred to the trauma unit in stable condition.  He tolerated the procedure well.  There were no intraoperative complications noted.  POST OPERATIVE INSTRUCTIONS: Plan for CT of pelvis postoperatively to identify any additional foreign objects Mobility: Per Trauma Surgery Pain control: Per Trauma Surgery DVT Prophylaxis: Per Trauma Surgery Further surgical plans: Pending results of CT pelvis, but no planned orthopedic interventions; will require return to the OR with Trauma Surgery for abdominal closure RUE: Weightbearing as tolerated, no restrictions LUE: Weightbearing as tolerated, no restrictions RLE: Weightbearing as tolerated, no  restrictions LLE: Weightbearing as tolerated, no restrictions Dressing care: The PREVENA incisional VAC (iVAC) should be left on for 7 days and then removed.  After that the wounds may be dressed with dry sterile gauze dressing and Tegaderm (clear adhesive plastic squares).  If the PREVENA hose is connected to a  large VAC machine in the hospital, this should be switched to the small portable PREVENA unit prior to discharge.  Please make sure a responsible party understands how to operate the [facility] unit before discharge, including how to charge the unit.  Please note that the [facility] dressing will ONLY work if connected to suction!  If the [facility] unit fails and cannot be fixed/restored to normal operation to restore suction, the sponge and plastic dressing should be removed and replaced with gauze/Tegaderm.  If the dressing loses suction due to a leak attempt to seal the leak with Tegaderm or remove the sponge plastic dressing replace with gauze/Tegaderm. Disposition: Per Trauma Surgery Follow-up: Please call [facility] [phone]) to schedule follow-op appointment for 2 weeks after surgery.  TOURNIQUET TIME: * No tourniquets in log *  BLOOD LOSS: 700 mL         DRAINS: none         SPECIMEN: none       COMPLICATIONS:  * No complications entered in OR log *         DISPOSITION: ICU - intubated and hemodynamically stable.         CONDITION: stable   Ernestina Columbia M.D. Orthopaedic Surgery [facility]   Portions of the record have been created with voice recognition software.  Grammatical and punctuation errors, random word insertions, wrong-word or "sound-a-like" substitutions, pronoun errors (inaccuracies and/or substitutions), and/or incomplete sentences may have occurred due to the inherent limitations of voice recognition software.  Not all errors are caught or corrected.  Although every attempt is made to root out erroneous and incomplete transcription, the note may still not fully represent the intent or opinion of the author.  Read the chart carefully and recognize, using context, where errors/substitutions have occurred.  Any questions or concerns about the content of this note or information contained within the body of this  dictation should be addressed directly with the author for clarification.

## 2022-01-14 ENCOUNTER — Encounter (HOSPITAL_COMMUNITY): Admission: EM | Disposition: A | Discharge status: Home / Self Care | Payer: Self-pay | Source: Home / Self Care

## 2022-01-14 ENCOUNTER — Inpatient Hospital Stay (HOSPITAL_COMMUNITY): Payer: Self-pay

## 2022-01-14 ENCOUNTER — Inpatient Hospital Stay (HOSPITAL_COMMUNITY): Payer: Self-pay | Admitting: Anesthesiology

## 2022-01-14 ENCOUNTER — Other Ambulatory Visit: Payer: Self-pay

## 2022-01-14 ENCOUNTER — Encounter (HOSPITAL_COMMUNITY): Payer: Self-pay

## 2022-01-14 DIAGNOSIS — T8189XA Other complications of procedures, not elsewhere classified, initial encounter: Secondary | ICD-10-CM

## 2022-01-14 DIAGNOSIS — D649 Anemia, unspecified: Secondary | ICD-10-CM

## 2022-01-14 HISTORY — PX: LAPAROTOMY: SHX154

## 2022-01-14 LAB — CBC
HCT: 29 % — ABNORMAL LOW (ref 39.0–52.0)
Hemoglobin: 10.1 g/dL — ABNORMAL LOW (ref 13.0–17.0)
MCH: 29.7 pg (ref 26.0–34.0)
MCHC: 34.8 g/dL (ref 30.0–36.0)
MCV: 85.3 fL (ref 80.0–100.0)
Platelets: 124 10*3/uL — ABNORMAL LOW (ref 150–400)
RBC: 3.4 MIL/uL — ABNORMAL LOW (ref 4.22–5.81)
RDW: 14.1 % (ref 11.5–15.5)
WBC: 7.7 10*3/uL (ref 4.0–10.5)
nRBC: 0 % (ref 0.0–0.2)

## 2022-01-14 LAB — BPAM RBC
Blood Product Expiration Date: 202308262359
Blood Product Expiration Date: 202308262359
Blood Product Expiration Date: 202308302359
Blood Product Expiration Date: 202308302359
ISSUE DATE / TIME: 202307291939
ISSUE DATE / TIME: 202307291939
ISSUE DATE / TIME: 202307292000
ISSUE DATE / TIME: 202307292001
Unit Type and Rh: 5100
Unit Type and Rh: 5100
Unit Type and Rh: 5100
Unit Type and Rh: 5100

## 2022-01-14 LAB — PREPARE FRESH FROZEN PLASMA

## 2022-01-14 LAB — POCT I-STAT 7, (LYTES, BLD GAS, ICA,H+H)
Acid-base deficit: 1 mmol/L (ref 0.0–2.0)
Acid-base deficit: 4 mmol/L — ABNORMAL HIGH (ref 0.0–2.0)
Acid-base deficit: 3 mmol/L — ABNORMAL HIGH (ref 0.0–2.0)
Bicarbonate: 23.2 mmol/L (ref 20.0–28.0)
Bicarbonate: 21.2 mmol/L (ref 20.0–28.0)
Bicarbonate: 21.1 mmol/L (ref 20.0–28.0)
Calcium, Ion: 1.12 mmol/L — ABNORMAL LOW (ref 1.15–1.40)
Calcium, Ion: 1.04 mmol/L — ABNORMAL LOW (ref 1.15–1.40)
Calcium, Ion: 0.94 mmol/L — ABNORMAL LOW (ref 1.15–1.40)
HCT: 39 % (ref 39.0–52.0)
HCT: 30 % — ABNORMAL LOW (ref 39.0–52.0)
HCT: 26 % — ABNORMAL LOW (ref 39.0–52.0)
Hemoglobin: 13.3 g/dL (ref 13.0–17.0)
Hemoglobin: 10.2 g/dL — ABNORMAL LOW (ref 13.0–17.0)
Hemoglobin: 8.8 g/dL — ABNORMAL LOW (ref 13.0–17.0)
O2 Saturation: 100 %
O2 Saturation: 100 %
O2 Saturation: 100 %
Patient temperature: 97.6
Potassium: 4.4 mmol/L (ref 3.5–5.1)
Potassium: 3.9 mmol/L (ref 3.5–5.1)
Potassium: 3.5 mmol/L (ref 3.5–5.1)
Sodium: 137 mmol/L (ref 135–145)
Sodium: 139 mmol/L (ref 135–145)
Sodium: 141 mmol/L (ref 135–145)
TCO2: 24 mmol/L (ref 22–32)
TCO2: 22 mmol/L (ref 22–32)
TCO2: 22 mmol/L (ref 22–32)
pCO2 arterial: 37.9 mmHg (ref 32–48)
pCO2 arterial: 37.3 mmHg (ref 32–48)
pCO2 arterial: 32.3 mmHg (ref 32–48)
pH, Arterial: 7.396 (ref 7.35–7.45)
pH, Arterial: 7.364 (ref 7.35–7.45)
pH, Arterial: 7.421 (ref 7.35–7.45)
pO2, Arterial: 301 mmHg — ABNORMAL HIGH (ref 83–108)
pO2, Arterial: 257 mmHg — ABNORMAL HIGH (ref 83–108)
pO2, Arterial: 413 mmHg — ABNORMAL HIGH (ref 83–108)

## 2022-01-14 LAB — TYPE AND SCREEN
ABO/RH(D): O POS
Antibody Screen: NEGATIVE
Unit division: 0
Unit division: 0
Unit division: 0
Unit division: 0

## 2022-01-14 LAB — BASIC METABOLIC PANEL
Anion gap: 7 (ref 5–15)
BUN: 17 mg/dL (ref 6–20)
CO2: 19 mmol/L — ABNORMAL LOW (ref 22–32)
Calcium: 7.5 mg/dL — ABNORMAL LOW (ref 8.9–10.3)
Chloride: 111 mmol/L (ref 98–111)
Creatinine, Ser: 1.19 mg/dL (ref 0.61–1.24)
GFR, Estimated: >60 mL/min (ref >60)
Glucose, Bld: 157 mg/dL — ABNORMAL HIGH (ref 70–99)
Potassium: 4.1 mmol/L (ref 3.5–5.1)
Sodium: 137 mmol/L (ref 135–145)

## 2022-01-14 LAB — BPAM FFP
Blood Product Expiration Date: 202308032359
ISSUE DATE / TIME: 202307292320
Unit Type and Rh: 5100

## 2022-01-14 LAB — TRIGLYCERIDES: Triglycerides: 52 mg/dL (ref <150)

## 2022-01-14 LAB — MRSA NEXT GEN BY PCR, NASAL: MRSA by PCR Next Gen: NOT DETECTED

## 2022-01-14 SURGERY — LAPAROTOMY, EXPLORATORY
Anesthesia: General | Site: Abdomen

## 2022-01-14 MED ORDER — PROMETHAZINE HCL 25 MG/ML IJ SOLN: 6.2500 mg | INTRAMUSCULAR | Status: DC | PRN

## 2022-01-14 MED ORDER — LACTATED RINGERS IV SOLN: INTRAVENOUS | Status: DC | PRN

## 2022-01-14 MED ORDER — MEPERIDINE HCL 25 MG/ML IJ SOLN: 6.2500 mg | INTRAMUSCULAR | Status: DC | PRN

## 2022-01-14 MED ORDER — DEXAMETHASONE SODIUM PHOSPHATE 10 MG/ML IJ SOLN
INTRAMUSCULAR | Status: DC | PRN
  Administered 2022-01-14: 5 mg via INTRAVENOUS

## 2022-01-14 MED ORDER — CEFAZOLIN SODIUM-DEXTROSE 2-4 GM/100ML-% IV SOLN
2.0000 g | INTRAVENOUS | Status: AC
  Administered 2022-01-14: 2 g via INTRAVENOUS
  Filled 2022-01-14: qty 100

## 2022-01-14 MED ORDER — PHENYLEPHRINE 80 MCG/ML (10ML) SYRINGE FOR IV PUSH (FOR BLOOD PRESSURE SUPPORT)
PREFILLED_SYRINGE | INTRAVENOUS | Status: DC | PRN
  Administered 2022-01-14: 80 ug via INTRAVENOUS

## 2022-01-14 MED ORDER — ONDANSETRON HCL 4 MG/2ML IJ SOLN
4.0000 mg | Freq: Four times a day (QID) | INTRAMUSCULAR | Status: DC | PRN
  Administered 2022-01-15 – 2022-01-18 (×4): 4 mg via INTRAVENOUS
  Filled 2022-01-14 (×4): qty 2

## 2022-01-14 MED ORDER — OXYCODONE HCL 5 MG/5ML PO SOLN
ORAL | Status: AC
  Filled 2022-01-14: qty 5

## 2022-01-14 MED ORDER — DOCUSATE SODIUM 100 MG PO CAPS
100.0000 mg | ORAL_CAPSULE | Freq: Two times a day (BID) | ORAL | Status: DC
  Administered 2022-01-15 – 2022-01-20 (×11): 100 mg via ORAL
  Filled 2022-01-14 (×11): qty 1

## 2022-01-14 MED ORDER — POLYETHYLENE GLYCOL 3350 17 G PO PACK
17.0000 g | PACK | Freq: Every day | ORAL | Status: DC
  Administered 2022-01-15 – 2022-01-18 (×4): 17 g via ORAL
  Filled 2022-01-14 (×4): qty 1

## 2022-01-14 MED ORDER — ONDANSETRON HCL 4 MG/2ML IJ SOLN
INTRAMUSCULAR | Status: AC
  Filled 2022-01-14: qty 2

## 2022-01-14 MED ORDER — MIDAZOLAM HCL 2 MG/2ML IJ SOLN: 0.5000 mg | Freq: Once | INTRAMUSCULAR | Status: DC | PRN

## 2022-01-14 MED ORDER — MIDAZOLAM HCL 2 MG/2ML IJ SOLN
INTRAMUSCULAR | Status: DC | PRN
  Administered 2022-01-14: 2 mg via INTRAVENOUS

## 2022-01-14 MED ORDER — DEXAMETHASONE SODIUM PHOSPHATE 10 MG/ML IJ SOLN
INTRAMUSCULAR | Status: AC
  Filled 2022-01-14: qty 1

## 2022-01-14 MED ORDER — HYDROMORPHONE HCL 1 MG/ML IJ SOLN
0.2500 mg | INTRAMUSCULAR | Status: DC | PRN
  Administered 2022-01-14 (×2): 0.5 mg via INTRAVENOUS

## 2022-01-14 MED ORDER — PROPOFOL 10 MG/ML IV BOLUS
INTRAVENOUS | Status: AC
  Filled 2022-01-14: qty 20

## 2022-01-14 MED ORDER — OXYCODONE HCL 5 MG PO TABS: 5.0000 mg | ORAL_TABLET | Freq: Once | ORAL | Status: AC | PRN

## 2022-01-14 MED ORDER — HYDROMORPHONE HCL 1 MG/ML IJ SOLN
0.5000 mg | INTRAMUSCULAR | Status: DC | PRN
  Administered 2022-01-14 – 2022-01-15 (×4): 1 mg via INTRAVENOUS
  Filled 2022-01-14 (×4): qty 1

## 2022-01-14 MED ORDER — HYDROMORPHONE HCL 1 MG/ML IJ SOLN
INTRAMUSCULAR | Status: AC
  Filled 2022-01-14: qty 1

## 2022-01-14 MED ORDER — ONDANSETRON HCL 4 MG/2ML IJ SOLN
INTRAMUSCULAR | Status: DC | PRN
  Administered 2022-01-14: 4 mg via INTRAVENOUS

## 2022-01-14 MED ORDER — CALCIUM GLUCONATE-NACL 2-0.675 GM/100ML-% IV SOLN
2.0000 g | Freq: Once | INTRAVENOUS | Status: DC
  Filled 2022-01-14: qty 100

## 2022-01-14 MED ORDER — CHLORHEXIDINE GLUCONATE CLOTH 2 % EX PADS
6.0000 | MEDICATED_PAD | Freq: Every day | CUTANEOUS | Status: DC
  Administered 2022-01-14 – 2022-01-16 (×3): 6 via TOPICAL

## 2022-01-14 MED ORDER — MIDAZOLAM HCL 2 MG/2ML IJ SOLN
INTRAMUSCULAR | Status: AC
  Filled 2022-01-14: qty 2

## 2022-01-14 MED ORDER — PROPOFOL 10 MG/ML IV BOLUS
INTRAVENOUS | Status: DC | PRN
  Administered 2022-01-14: 30 mg via INTRAVENOUS

## 2022-01-14 MED ORDER — FENTANYL CITRATE (PF) 250 MCG/5ML IJ SOLN
INTRAMUSCULAR | Status: DC | PRN
  Administered 2022-01-14: 50 ug via INTRAVENOUS

## 2022-01-14 MED ORDER — ACETAMINOPHEN 500 MG PO TABS
1000.0000 mg | ORAL_TABLET | Freq: Four times a day (QID) | ORAL | Status: AC
Start: 2022-01-14 — End: 2022-01-15
  Administered 2022-01-14 – 2022-01-15 (×3): 1000 mg via ORAL
  Filled 2022-01-14 (×3): qty 2

## 2022-01-14 MED ORDER — 0.9 % SODIUM CHLORIDE (POUR BTL) OPTIME
TOPICAL | Status: DC | PRN
  Administered 2022-01-14: 1000 mL

## 2022-01-14 MED ORDER — ARTIFICIAL TEARS OPHTHALMIC OINT
TOPICAL_OINTMENT | OPHTHALMIC | Status: AC
Start: 2022-01-14 — End: ?
  Filled 2022-01-14: qty 7

## 2022-01-14 MED ORDER — ROCURONIUM BROMIDE 10 MG/ML (PF) SYRINGE
PREFILLED_SYRINGE | INTRAVENOUS | Status: DC | PRN
  Administered 2022-01-14: 40 mg via INTRAVENOUS

## 2022-01-14 MED ORDER — OXYCODONE HCL 5 MG PO TABS
5.0000 mg | ORAL_TABLET | Freq: Four times a day (QID) | ORAL | Status: DC | PRN
  Administered 2022-01-14 – 2022-01-15 (×3): 5 mg via ORAL
  Filled 2022-01-14 (×3): qty 1

## 2022-01-14 MED ORDER — OXYCODONE HCL 5 MG/5ML PO SOLN
5.0000 mg | Freq: Once | ORAL | Status: AC | PRN
  Administered 2022-01-14: 5 mg via ORAL

## 2022-01-14 MED ORDER — FENTANYL CITRATE (PF) 250 MCG/5ML IJ SOLN
INTRAMUSCULAR | Status: AC
  Filled 2022-01-14: qty 5

## 2022-01-14 SURGICAL SUPPLY — 24 items
APL PRP STRL LF DISP 70% ISPRP (MISCELLANEOUS) ×1
BAG COUNTER SPONGE SURGICOUNT (BAG) ×2 IMPLANT
BAG SPNG CNTER NS LX DISP (BAG) ×1
CHLORAPREP W/TINT 26 (MISCELLANEOUS) ×2 IMPLANT
COVER SURGICAL LIGHT HANDLE (MISCELLANEOUS) ×2 IMPLANT
DRAPE LAPAROSCOPIC ABDOMINAL (DRAPES) ×2 IMPLANT
DRSG OPSITE POSTOP 4X10 (GAUZE/BANDAGES/DRESSINGS) ×1 IMPLANT
ELECT CAUTERY BLADE 6.4 (BLADE) ×2 IMPLANT
ELECT REM PT RETURN 9FT ADLT (ELECTROSURGICAL) ×2
ELECTRODE REM PT RTRN 9FT ADLT (ELECTROSURGICAL) ×1 IMPLANT
GLOVE BIOGEL PI IND STRL 7.0 (GLOVE) ×1 IMPLANT
GLOVE BIOGEL PI INDICATOR 7.0 (GLOVE) ×1
GLOVE SURG SS PI 7.0 STRL IVOR (GLOVE) ×2 IMPLANT
GOWN STRL REUS W/ TWL LRG LVL3 (GOWN DISPOSABLE) ×2 IMPLANT
GOWN STRL REUS W/TWL LRG LVL3 (GOWN DISPOSABLE) ×4
HANDLE SUCTION POOLE (INSTRUMENTS) ×1 IMPLANT
KIT BASIN OR (CUSTOM PROCEDURE TRAY) ×2 IMPLANT
PACK GENERAL/GYN (CUSTOM PROCEDURE TRAY) ×2 IMPLANT
PENCIL SMOKE EVACUATOR (MISCELLANEOUS) ×2 IMPLANT
STAPLER VISISTAT 35W (STAPLE) ×3 IMPLANT
SUCTION POOLE HANDLE (INSTRUMENTS) ×2
SUT PDS AB 3-0 SH 27 (SUTURE) ×4 IMPLANT
TOWEL GREEN STERILE (TOWEL DISPOSABLE) ×2 IMPLANT
YANKAUER SUCT BULB TIP NO VENT (SUCTIONS) ×1 IMPLANT

## 2022-01-15 ENCOUNTER — Encounter (HOSPITAL_COMMUNITY): Payer: Self-pay | Admitting: General Surgery

## 2022-01-15 ENCOUNTER — Inpatient Hospital Stay (HOSPITAL_COMMUNITY): Payer: Self-pay

## 2022-01-15 LAB — CBC
HCT: 27.6 % — ABNORMAL LOW (ref 39.0–52.0)
Hemoglobin: 9.3 g/dL — ABNORMAL LOW (ref 13.0–17.0)
MCH: 29.2 pg (ref 26.0–34.0)
MCHC: 33.7 g/dL (ref 30.0–36.0)
MCV: 86.8 fL (ref 80.0–100.0)
Platelets: 148 10*3/uL — ABNORMAL LOW (ref 150–400)
RBC: 3.18 MIL/uL — ABNORMAL LOW (ref 4.22–5.81)
RDW: 14.3 % (ref 11.5–15.5)
WBC: 9.5 10*3/uL (ref 4.0–10.5)
nRBC: 0 % (ref 0.0–0.2)

## 2022-01-15 LAB — BPAM FFP
Blood Product Expiration Date: 202307312359
Blood Product Expiration Date: 202308012359
ISSUE DATE / TIME: 202307292001
ISSUE DATE / TIME: 202307300620
Unit Type and Rh: 6200
Unit Type and Rh: 6200

## 2022-01-15 LAB — BASIC METABOLIC PANEL
Anion gap: 8 (ref 5–15)
BUN: 15 mg/dL (ref 6–20)
CO2: 24 mmol/L (ref 22–32)
Calcium: 8.3 mg/dL — ABNORMAL LOW (ref 8.9–10.3)
Chloride: 104 mmol/L (ref 98–111)
Creatinine, Ser: 1.11 mg/dL (ref 0.61–1.24)
GFR, Estimated: >60 mL/min (ref >60)
Glucose, Bld: 145 mg/dL — ABNORMAL HIGH (ref 70–99)
Potassium: 4 mmol/L (ref 3.5–5.1)
Sodium: 136 mmol/L (ref 135–145)

## 2022-01-15 LAB — PREPARE FRESH FROZEN PLASMA: Unit division: 0

## 2022-01-15 LAB — HIV ANTIBODY (ROUTINE TESTING W REFLEX): HIV Screen 4th Generation wRfx: NONREACTIVE

## 2022-01-15 LAB — BLOOD PRODUCT ORDER (VERBAL) VERIFICATION

## 2022-01-15 MED ORDER — TAMSULOSIN HCL 0.4 MG PO CAPS
0.4000 mg | ORAL_CAPSULE | Freq: Every day | ORAL | Status: DC
  Administered 2022-01-15 – 2022-01-20 (×6): 0.4 mg via ORAL
  Filled 2022-01-15 (×6): qty 1

## 2022-01-15 MED ORDER — METHOCARBAMOL 1000 MG/10ML IJ SOLN
1000.0000 mg | Freq: Three times a day (TID) | INTRAVENOUS | Status: DC
  Administered 2022-01-15 – 2022-01-17 (×7): 1000 mg via INTRAVENOUS
  Filled 2022-01-15 (×10): qty 10

## 2022-01-15 MED ORDER — HYDROMORPHONE HCL 1 MG/ML IJ SOLN
0.5000 mg | INTRAMUSCULAR | Status: DC | PRN
  Administered 2022-01-15 – 2022-01-16 (×2): 1 mg via INTRAVENOUS
  Filled 2022-01-15 (×2): qty 1

## 2022-01-15 MED ORDER — OXYCODONE HCL 5 MG/5ML PO SOLN
5.0000 mg | ORAL | Status: DC | PRN
  Administered 2022-01-16 – 2022-01-17 (×3): 5 mg
  Filled 2022-01-15 (×2): qty 10
  Filled 2022-01-15 (×3): qty 5

## 2022-01-15 MED ORDER — ACETAMINOPHEN 10 MG/ML IV SOLN
1000.0000 mg | Freq: Four times a day (QID) | INTRAVENOUS | Status: AC
  Administered 2022-01-15 – 2022-01-16 (×4): 1000 mg via INTRAVENOUS
  Filled 2022-01-15 (×4): qty 100

## 2022-01-15 MED ORDER — KETOROLAC TROMETHAMINE 15 MG/ML IJ SOLN
30.0000 mg | Freq: Four times a day (QID) | INTRAMUSCULAR | Status: DC
  Administered 2022-01-15 – 2022-01-19 (×16): 30 mg via INTRAVENOUS
  Filled 2022-01-15 (×16): qty 2

## 2022-01-15 MED ORDER — SODIUM CHLORIDE 0.9 % IV SOLN
25.0000 mg | Freq: Four times a day (QID) | INTRAVENOUS | Status: DC | PRN
  Administered 2022-01-15 – 2022-01-19 (×2): 25 mg via INTRAVENOUS
  Filled 2022-01-15 (×2): qty 1

## 2022-01-15 MED ORDER — LACTATED RINGERS IV SOLN: INTRAVENOUS | Status: DC

## 2022-01-16 LAB — CBC
HCT: 22.6 % — ABNORMAL LOW (ref 39.0–52.0)
Hemoglobin: 7.7 g/dL — ABNORMAL LOW (ref 13.0–17.0)
MCH: 29.8 pg (ref 26.0–34.0)
MCHC: 34.1 g/dL (ref 30.0–36.0)
MCV: 87.6 fL (ref 80.0–100.0)
Platelets: 104 10*3/uL — ABNORMAL LOW (ref 150–400)
RBC: 2.58 MIL/uL — ABNORMAL LOW (ref 4.22–5.81)
RDW: 14.3 % (ref 11.5–15.5)
WBC: 5 10*3/uL (ref 4.0–10.5)
nRBC: 0 % (ref 0.0–0.2)

## 2022-01-16 LAB — BASIC METABOLIC PANEL
Anion gap: 3 — ABNORMAL LOW (ref 5–15)
BUN: 12 mg/dL (ref 6–20)
CO2: 25 mmol/L (ref 22–32)
Calcium: 7.6 mg/dL — ABNORMAL LOW (ref 8.9–10.3)
Chloride: 107 mmol/L (ref 98–111)
Creatinine, Ser: 0.94 mg/dL (ref 0.61–1.24)
GFR, Estimated: >60 mL/min (ref >60)
Glucose, Bld: 107 mg/dL — ABNORMAL HIGH (ref 70–99)
Potassium: 3.7 mmol/L (ref 3.5–5.1)
Sodium: 135 mmol/L (ref 135–145)

## 2022-01-16 MED ORDER — ACETAMINOPHEN 160 MG/5ML PO SOLN: 650.0000 mg | Freq: Four times a day (QID) | ORAL | Status: DC | PRN

## 2022-01-16 MED ORDER — ENOXAPARIN SODIUM 30 MG/0.3ML IJ SOSY
30.0000 mg | PREFILLED_SYRINGE | Freq: Two times a day (BID) | INTRAMUSCULAR | Status: DC
  Administered 2022-01-16 – 2022-01-20 (×8): 30 mg via SUBCUTANEOUS
  Filled 2022-01-16 (×7): qty 0.3

## 2022-01-17 LAB — BASIC METABOLIC PANEL
Anion gap: 6 (ref 5–15)
BUN: 12 mg/dL (ref 6–20)
CO2: 23 mmol/L (ref 22–32)
Calcium: 7.7 mg/dL — ABNORMAL LOW (ref 8.9–10.3)
Chloride: 105 mmol/L (ref 98–111)
Creatinine, Ser: 0.97 mg/dL (ref 0.61–1.24)
GFR, Estimated: >60 mL/min (ref >60)
Glucose, Bld: 105 mg/dL — ABNORMAL HIGH (ref 70–99)
Potassium: 3.5 mmol/L (ref 3.5–5.1)
Sodium: 134 mmol/L — ABNORMAL LOW (ref 135–145)

## 2022-01-17 LAB — CBC
HCT: 23.4 % — ABNORMAL LOW (ref 39.0–52.0)
Hemoglobin: 7.9 g/dL — ABNORMAL LOW (ref 13.0–17.0)
MCH: 29.6 pg (ref 26.0–34.0)
MCHC: 33.8 g/dL (ref 30.0–36.0)
MCV: 87.6 fL (ref 80.0–100.0)
Platelets: 127 10*3/uL — ABNORMAL LOW (ref 150–400)
RBC: 2.67 MIL/uL — ABNORMAL LOW (ref 4.22–5.81)
RDW: 13.8 % (ref 11.5–15.5)
WBC: 4.2 10*3/uL (ref 4.0–10.5)
nRBC: 0 % (ref 0.0–0.2)

## 2022-01-17 LAB — MAGNESIUM: Magnesium: 1.9 mg/dL (ref 1.7–2.4)

## 2022-01-17 MED ORDER — POTASSIUM CHLORIDE 20 MEQ PO PACK
40.0000 meq | PACK | Freq: Two times a day (BID) | ORAL | Status: AC
  Administered 2022-01-17: 40 meq via ORAL
  Filled 2022-01-17 (×2): qty 2

## 2022-01-17 MED ORDER — MONTELUKAST SODIUM 10 MG PO TABS
10.0000 mg | ORAL_TABLET | Freq: Every day | ORAL | Status: DC
  Administered 2022-01-17 – 2022-01-19 (×3): 10 mg via ORAL
  Filled 2022-01-17 (×3): qty 1

## 2022-01-17 MED ORDER — LORATADINE 10 MG PO TABS
10.0000 mg | ORAL_TABLET | Freq: Every day | ORAL | Status: DC
  Administered 2022-01-17 – 2022-01-20 (×4): 10 mg via ORAL
  Filled 2022-01-17 (×4): qty 1

## 2022-01-17 MED ORDER — METHOCARBAMOL 500 MG PO TABS
1000.0000 mg | ORAL_TABLET | Freq: Three times a day (TID) | ORAL | Status: DC
  Administered 2022-01-17 – 2022-01-20 (×7): 1000 mg via ORAL
  Filled 2022-01-17 (×8): qty 2

## 2022-01-17 MED ORDER — CALCIUM CARBONATE ANTACID 500 MG PO CHEW
2.0000 | CHEWABLE_TABLET | Freq: Four times a day (QID) | ORAL | Status: DC | PRN
  Administered 2022-01-17 – 2022-01-18 (×3): 400 mg via ORAL
  Filled 2022-01-17 (×3): qty 2

## 2022-01-17 MED ORDER — HYDROMORPHONE HCL 1 MG/ML IJ SOLN: 0.5000 mg | INTRAMUSCULAR | Status: DC | PRN

## 2022-01-17 MED ORDER — PANTOPRAZOLE SODIUM 40 MG PO TBEC
40.0000 mg | DELAYED_RELEASE_TABLET | Freq: Every day | ORAL | Status: DC
  Administered 2022-01-17 – 2022-01-20 (×4): 40 mg via ORAL
  Filled 2022-01-17 (×4): qty 1

## 2022-01-17 MED ORDER — OXYCODONE HCL 5 MG PO TABS
5.0000 mg | ORAL_TABLET | ORAL | Status: DC | PRN
  Administered 2022-01-17 (×2): 10 mg via ORAL
  Administered 2022-01-18 – 2022-01-19 (×3): 5 mg via ORAL
  Filled 2022-01-17: qty 1
  Filled 2022-01-17: qty 2
  Filled 2022-01-17: qty 1
  Filled 2022-01-17: qty 2
  Filled 2022-01-17: qty 1

## 2022-01-17 MED ORDER — BISACODYL 10 MG RE SUPP: 10.0000 mg | Freq: Every day | RECTAL | Status: DC | PRN

## 2022-01-17 MED ORDER — ACETAMINOPHEN 500 MG PO TABS
1000.0000 mg | ORAL_TABLET | Freq: Three times a day (TID) | ORAL | Status: DC
  Administered 2022-01-17 – 2022-01-20 (×8): 1000 mg via ORAL
  Filled 2022-01-17 (×8): qty 2

## 2022-01-17 MED ORDER — IPRATROPIUM-ALBUTEROL 0.5-2.5 (3) MG/3ML IN SOLN: 3.0000 mL | Freq: Four times a day (QID) | RESPIRATORY_TRACT | Status: DC | PRN

## 2022-01-17 MED ORDER — GUAIFENESIN ER 600 MG PO TB12
600.0000 mg | ORAL_TABLET | Freq: Two times a day (BID) | ORAL | Status: DC
  Administered 2022-01-17 – 2022-01-19 (×3): 600 mg via ORAL
  Filled 2022-01-17 (×7): qty 1

## 2022-01-17 MED ORDER — POTASSIUM CHLORIDE CRYS ER 10 MEQ PO TBCR
40.0000 meq | EXTENDED_RELEASE_TABLET | Freq: Once | ORAL | Status: AC
  Administered 2022-01-17: 40 meq via ORAL
  Filled 2022-01-17: qty 4

## 2022-01-18 LAB — BASIC METABOLIC PANEL
Anion gap: 4 — ABNORMAL LOW (ref 5–15)
BUN: 9 mg/dL (ref 6–20)
CO2: 25 mmol/L (ref 22–32)
Calcium: 8.1 mg/dL — ABNORMAL LOW (ref 8.9–10.3)
Chloride: 105 mmol/L (ref 98–111)
Creatinine, Ser: 0.92 mg/dL (ref 0.61–1.24)
GFR, Estimated: >60 mL/min (ref >60)
Glucose, Bld: 105 mg/dL — ABNORMAL HIGH (ref 70–99)
Potassium: 4.4 mmol/L (ref 3.5–5.1)
Sodium: 134 mmol/L — ABNORMAL LOW (ref 135–145)

## 2022-01-18 LAB — CBC
HCT: 24.2 % — ABNORMAL LOW (ref 39.0–52.0)
Hemoglobin: 8.3 g/dL — ABNORMAL LOW (ref 13.0–17.0)
MCH: 29.4 pg (ref 26.0–34.0)
MCHC: 34.3 g/dL (ref 30.0–36.0)
MCV: 85.8 fL (ref 80.0–100.0)
Platelets: 162 10*3/uL (ref 150–400)
RBC: 2.82 MIL/uL — ABNORMAL LOW (ref 4.22–5.81)
RDW: 13.4 % (ref 11.5–15.5)
WBC: 4.2 10*3/uL (ref 4.0–10.5)
nRBC: 0 % (ref 0.0–0.2)

## 2022-01-18 MED ORDER — BOOST / RESOURCE BREEZE PO LIQD CUSTOM
1.0000 | Freq: Two times a day (BID) | ORAL | Status: DC
  Administered 2022-01-18 – 2022-01-19 (×3): 1 via ORAL

## 2022-01-19 ENCOUNTER — Inpatient Hospital Stay (HOSPITAL_COMMUNITY): Payer: Self-pay

## 2022-01-19 LAB — BASIC METABOLIC PANEL
Anion gap: 4 — ABNORMAL LOW (ref 5–15)
BUN: 12 mg/dL (ref 6–20)
CO2: 25 mmol/L (ref 22–32)
Calcium: 8.1 mg/dL — ABNORMAL LOW (ref 8.9–10.3)
Chloride: 106 mmol/L (ref 98–111)
Creatinine, Ser: 0.93 mg/dL (ref 0.61–1.24)
GFR, Estimated: >60 mL/min (ref >60)
Glucose, Bld: 103 mg/dL — ABNORMAL HIGH (ref 70–99)
Potassium: 4.2 mmol/L (ref 3.5–5.1)
Sodium: 135 mmol/L (ref 135–145)

## 2022-01-19 LAB — MAGNESIUM: Magnesium: 2.1 mg/dL (ref 1.7–2.4)

## 2022-01-19 MED ORDER — BISACODYL 10 MG RE SUPP
10.0000 mg | Freq: Once | RECTAL | Status: AC
  Administered 2022-01-19: 10 mg via RECTAL
  Filled 2022-01-19: qty 1

## 2022-01-19 MED ORDER — MAGNESIUM HYDROXIDE 400 MG/5ML PO SUSP
30.0000 mL | Freq: Once | ORAL | Status: AC
  Administered 2022-01-19: 30 mL via ORAL
  Filled 2022-01-19: qty 30

## 2022-01-19 MED ORDER — POLYETHYLENE GLYCOL 3350 17 G PO PACK
17.0000 g | PACK | Freq: Two times a day (BID) | ORAL | Status: DC
  Administered 2022-01-19: 17 g via ORAL
  Filled 2022-01-19 (×2): qty 1

## 2022-01-19 MED ORDER — CHLORHEXIDINE GLUCONATE CLOTH 2 % EX PADS
6.0000 | MEDICATED_PAD | Freq: Every day | CUTANEOUS | Status: DC
Start: 2022-01-19 — End: 2022-01-20
  Administered 2022-01-19: 6 via TOPICAL

## 2022-01-19 MED ORDER — BISACODYL 5 MG PO TBEC
10.0000 mg | DELAYED_RELEASE_TABLET | Freq: Once | ORAL | Status: AC
Start: 2022-01-19 — End: 2022-01-19
  Administered 2022-01-19: 10 mg via ORAL
  Filled 2022-01-19: qty 2

## 2022-01-19 MED ORDER — SENNA 8.6 MG PO TABS
2.0000 | ORAL_TABLET | Freq: Once | ORAL | Status: AC
  Administered 2022-01-19: 17.2 mg via ORAL
  Filled 2022-01-19: qty 2

## 2022-01-20 MED ORDER — TAMSULOSIN HCL 0.4 MG PO CAPS: 0.4000 mg | ORAL_CAPSULE | Freq: Every day | ORAL | 0 refills | Status: AC

## 2022-01-20 MED ORDER — METHOCARBAMOL 1000 MG PO TABS: 1000.0000 mg | ORAL_TABLET | Freq: Three times a day (TID) | ORAL | 0 refills | Status: AC

## 2022-01-20 MED ORDER — ACETAMINOPHEN 500 MG PO TABS
1000.0000 mg | ORAL_TABLET | Freq: Three times a day (TID) | ORAL | 0 refills | Status: AC
Start: 2022-01-20 — End: ?

## 2022-01-20 MED ORDER — POLYETHYLENE GLYCOL 3350 17 G PO PACK: 17.0000 g | PACK | Freq: Two times a day (BID) | ORAL | 0 refills | Status: AC

## 2022-01-20 MED ORDER — OXYCODONE HCL 5 MG PO TABS: 5.0000 mg | ORAL_TABLET | ORAL | 0 refills | Status: AC | PRN
